# Patient Record
Sex: Male | Born: 1995
Health system: Southern US, Community
[De-identification: ages and names within clinical notes are randomized; demographics above are authoritative.]

---

## 2015-07-13 ENCOUNTER — Ambulatory Visit: Payer: Self-pay | Attending: Internal Medicine

## 2015-07-15 ENCOUNTER — Ambulatory Visit: Payer: Self-pay

## 2017-04-26 ENCOUNTER — Ambulatory Visit: Payer: BLUE CROSS/BLUE SHIELD | Admitting: Family Medicine

## 2017-04-26 ENCOUNTER — Other Ambulatory Visit: Payer: Self-pay

## 2017-04-26 ENCOUNTER — Encounter: Payer: Self-pay | Admitting: Family Medicine

## 2017-04-26 VITALS — BP 130/78 | HR 67 | Temp 98.1°F | Ht 66.0 in | Wt 123.2 lb

## 2017-04-26 DIAGNOSIS — Z131 Encounter for screening for diabetes mellitus: Secondary | ICD-10-CM

## 2017-04-26 DIAGNOSIS — Z13220 Encounter for screening for lipoid disorders: Secondary | ICD-10-CM

## 2017-04-26 DIAGNOSIS — Z0000 Encounter for general adult medical examination without abnormal findings: Secondary | ICD-10-CM | POA: Diagnosis not present

## 2017-04-26 DIAGNOSIS — H547 Unspecified visual loss: Secondary | ICD-10-CM

## 2017-04-26 LAB — COMPREHENSIVE METABOLIC PANEL
A/G RATIO: 1.5 (ref 1.2–2.2)
ALK PHOS: 117 IU/L (ref 39–117)
ALT: 14 IU/L (ref 0–44)
AST: 14 IU/L (ref 0–40)
Albumin: 4.5 g/dL (ref 3.5–5.5)
BILIRUBIN TOTAL: 0.3 mg/dL (ref 0.0–1.2)
BUN/Creatinine Ratio: 14 (ref 9–20)
BUN: 12 mg/dL (ref 6–20)
CALCIUM: 9.5 mg/dL (ref 8.7–10.2)
CHLORIDE: 100 mmol/L (ref 96–106)
CO2: 27 mmol/L (ref 20–29)
Creatinine, Ser: 0.88 mg/dL (ref 0.76–1.27)
GFR calc Af Amer: 139 mL/min/{1.73_m2} (ref >59)
GFR calc non Af Amer: 120 mL/min/{1.73_m2} (ref >59)
GLOBULIN, TOTAL: 3 g/dL (ref 1.5–4.5)
Glucose: 93 mg/dL (ref 65–99)
Potassium: 4.3 mmol/L (ref 3.5–5.2)
SODIUM: 139 mmol/L (ref 134–144)
Total Protein: 7.5 g/dL (ref 6.0–8.5)

## 2017-04-26 LAB — LIPID PANEL
CHOL/HDL RATIO: 2.9 ratio (ref 0.0–5.0)
CHOLESTEROL TOTAL: 145 mg/dL (ref 100–199)
HDL: 50 mg/dL (ref >39)
LDL Calculated: 79 mg/dL (ref 0–99)
TRIGLYCERIDES: 78 mg/dL (ref 0–149)
VLDL Cholesterol Cal: 16 mg/dL (ref 5–40)

## 2017-04-26 NOTE — Progress Notes (Signed)
Subjective:  By signing my name below, I, Essence Howell, attest that this documentation has been prepared under the direction and in the presence of Shade Flood, MD Electronically Signed: Charline Bills, ED Scribe 04/26/2017 at 11:39 AM.   Patient ID: Adam Duarte, male    DOB: [birth year], 22 y.o.   MRN: [id number]  Chief Complaint  Patient presents with  . Establish Care    general check up (eyes)   HPI Adam Duarte is a 22 y.o. male who presents to Primary Care at [facility] to establish care. Pt has not had a previous PCP recently. No pertinent medical problems other than a h/o urethral stone several yrs ago in [place]. Family h/o significant for HTN in his father. Pt is fasting at this visit.  Reports difficulty seeing. He has not had a recent eye exam. Pt does not wear eye glasses or contacts.   Visual Acuity Screening   Right eye Left eye Both eyes  Without correction: 20/40 20/40 20/40   With correction:      Dentist: last saw a dentist in [place] 4-5 months ago Exercise: 2-3 days/wk  STI Screening: Pt has never been sexually active. He had a non-reactive HIV screening when he moved to the [place] in Nov 2016 from [place].  There are no active problems to display for this patient.  History reviewed. No pertinent past medical history. History reviewed. No pertinent surgical history. Not on File Prior to Admission medications   Not on File   Social History   Socioeconomic History  . Marital status: Single    Spouse name: Not on file  . Number of children: Not on file  . Years of education: Not on file  . Highest education level: Not on file  Occupational History  . Not on file  Social Needs  . Financial resource strain: Not on file  . Food insecurity:    Worry: Not on file    Inability: Not on file  . Transportation needs:    Medical: Not on file    Non-medical: Not on file  Tobacco Use  . Smoking status: Never Smoker  . Smokeless tobacco: Never Used    Substance and Sexual Activity  . Alcohol use: Never    Frequency: Never  . Drug use: Not on file  . Sexual activity: Yes  Lifestyle  . Physical activity:    Days per week: Not on file    Minutes per session: Not on file  . Stress: Not on file  Relationships  . Social connections:    Talks on phone: Not on file    Gets together: Not on file    Attends religious service: Not on file    Active member of club or organization: Not on file    Attends meetings of clubs or organizations: Not on file    Relationship status: Not on file  . Intimate partner violence:    Fear of current or ex partner: Not on file    Emotionally abused: Not on file    Physically abused: Not on file    Forced sexual activity: Not on file  Other Topics Concern  . Not on file  Social History Narrative  . Not on file   Review of Systems  Eyes: Positive for visual disturbance.      Objective:   Physical Exam  Constitutional: He is oriented to person, place, and time. He appears well-developed and well-nourished.  HENT:  Head: Normocephalic and atraumatic.  Right Ear:  External ear normal.  Left Ear: External ear normal.  Mouth/Throat: Oropharynx is clear and moist.  Eyes: Pupils are equal, round, and reactive to light. Conjunctivae and EOM are normal.  Neck: Normal range of motion. Neck supple. No thyromegaly present.  Cardiovascular: Normal rate, regular rhythm, normal heart sounds and intact distal pulses.  Pulmonary/Chest: Effort normal and breath sounds normal. No respiratory distress. He has no wheezes.  Abdominal: Soft. He exhibits no distension. There is no tenderness. Hernia confirmed negative in the right inguinal area and confirmed negative in the left inguinal area.  Genitourinary: Prostate normal.  Musculoskeletal: Normal range of motion. He exhibits no edema or tenderness.  Lymphadenopathy:    He has no cervical adenopathy.  Neurological: He is alert and oriented to person, place, and  time. He has normal reflexes.  Skin: Skin is warm and dry.  Psychiatric: He has a normal mood and affect. His behavior is normal.  Vitals reviewed.    Vitals:   04/26/17 1054  BP: 130/78  Pulse: 67  Temp: 98.1 F (36.7 C)  TempSrc: Oral  SpO2: 99%  Weight: 123 lb 3.2 oz (55.9 kg)  Height: 5\' 6"  (1.676 m)      Assessment & Plan:  Adam Duarte is a 22 y.o. male Annual physical exam  - -anticipatory guidance as below in AVS, screening labs above. Health maintenance items as above in HPI discussed/recommended as applicable.   Screening for diabetes mellitus - Plan: Comprehensive metabolic panel  Screening for hyperlipidemia - Plan: Comprehensive metabolic panel, Lipid panel  Decreased visual acuity - Plan: Ambulatory referral to Ophthalmology  Interpreter present as well as some English spoken-  understanding expressed   No orders of the defined types were placed in this encounter.  Patient Instructions   I would recommend evaluation with eye doctor - I will refer you.   Thank you for coming in today.   Keeping you healthy  Get these tests  Blood pressure- Have your blood pressure checked once a year by your healthcare provider.  Normal blood pressure is 120/80.  Weight- Have your body mass index (BMI) calculated to screen for obesity.  BMI is a measure of body fat based on height and weight. You can also calculate your own BMI at https://www.west-esparza.com/.  Cholesterol- Have your cholesterol checked regularly starting at age 25, sooner may be necessary if you have diabetes, high blood pressure, if a family member developed heart diseases at an early age or if you smoke.   Chlamydia, HIV, and other sexual transmitted disease- Get screened each year until the age of 46 then within three months of each new sexual partner.  Diabetes- Have your blood sugar checked regularly if you have high blood pressure, high cholesterol, a family history of diabetes or if you are  overweight.  Get these vaccines  Flu shot- Every fall.  Tetanus shot- Every 10 years.  Menactra- Single dose; prevents meningitis.  Take these steps  Don't smoke- If you do smoke, ask your healthcare provider about quitting. For tips on how to quit, go to www.smokefree.gov or call 1-800-QUIT-NOW.  Be physically active- Exercise 5 days a week for at least 30 minutes.  If you are not already physically active start slow and gradually work up to 30 minutes of moderate physical activity.  Examples of moderate activity include walking briskly, mowing the yard, dancing, swimming bicycling, etc.  Eat a healthy diet- Eat a variety of healthy foods such as fruits, vegetables, low fat milk, low  fat cheese, yogurt, lean meats, poultry, fish, beans, tofu, etc.  For more information on healthy eating, go to www.thenutritionsource.org  Drink alcohol in moderation- Limit alcohol intake two drinks or less a day.  Never drink and drive.  Dentist- Brush and floss teeth twice daily; visit your dentis twice a year.  Depression-Your emotional health is as important as your physical health.  If you're feeling down, losing interest in things you normally enjoy please talk with your healthcare provider.  Gun Safety- If you keep a gun in your home, keep it unloaded and with the safety lock on.  Bullets should be stored separately.  Helmet use- Always wear a helmet when riding a motorcycle, bicycle, rollerblading or skateboarding.  Safe sex- If you may be exposed to a sexually transmitted infection, use a condom  Seat belts- Seat bels can save your life; always wear one.  Smoke/Carbon Monoxide detectors- These detectors need to be installed on the appropriate level of your home.  Replace batteries at least once a year.  Skin Cancer- When out in the sun, cover up and use sunscreen SPF 15 or higher.  Violence- If anyone is threatening or hurting you, please tell your healthcare provider.   IF you received  an x-ray today, you will receive an invoice from [facility]. Please contact [facility] at [phone] with questions or concerns regarding your invoice.   IF you received labwork today, you will receive an invoice from [facility]. Please contact [facility] at [phone] with questions or concerns regarding your invoice.   Our billing staff will not be able to assist you with questions regarding bills from these companies.  You will be contacted with the lab results as soon as they are available. The fastest way to get your results is to activate your My Chart account. Instructions are located on the last page of this paperwork. If you have not heard from us regarding the results in 2 weeks, please contact this office.       I personally performed the services described in this documentation, which was scribed in my presence. The recorded information has been reviewed and considered for accuracy and completeness, addended by me as needed, and agree with information above.  Signed,   Meredith StaggersJeffrey Margret Moat, MD Primary Care at [facility].  04/26/17 12:49 PM

## 2017-04-26 NOTE — Patient Instructions (Addendum)
I would recommend evaluation with eye doctor - I will refer you.   Thank you for coming in today.   Keeping you healthy  Get these tests  Blood pressure- Have your blood pressure checked once a year by your healthcare provider.  Normal blood pressure is 120/80.  Weight- Have your body mass index (BMI) calculated to screen for obesity.  BMI is a measure of body fat based on height and weight. You can also calculate your own BMI at https://www.west-esparza.com/www.nhlbisupport.com/bmi/.  Cholesterol- Have your cholesterol checked regularly starting at age 24, sooner may be necessary if you have diabetes, high blood pressure, if a family member developed heart diseases at an early age or if you smoke.   Chlamydia, HIV, and other sexual transmitted disease- Get screened each year until the age of 24 then within three months of each new sexual partner.  Diabetes- Have your blood sugar checked regularly if you have high blood pressure, high cholesterol, a family history of diabetes or if you are overweight.  Get these vaccines  Flu shot- Every fall.  Tetanus shot- Every 10 years.  Menactra- Single dose; prevents meningitis.  Take these steps  Don't smoke- If you do smoke, ask your healthcare provider about quitting. For tips on how to quit, go to www.smokefree.gov or call 1-800-QUIT-NOW.  Be physically active- Exercise 5 days a week for at least 30 minutes.  If you are not already physically active start slow and gradually work up to 30 minutes of moderate physical activity.  Examples of moderate activity include walking briskly, mowing the yard, dancing, swimming bicycling, etc.  Eat a healthy diet- Eat a variety of healthy foods such as fruits, vegetables, low fat milk, low fat cheese, yogurt, lean meats, poultry, fish, beans, tofu, etc.  For more information on healthy eating, go to www.thenutritionsource.org  Drink alcohol in moderation- Limit alcohol intake two drinks or less a day.  Never drink and  drive.  Dentist- Brush and floss teeth twice daily; visit your dentis twice a year.  Depression-Your emotional health is as important as your physical health.  If you're feeling down, losing interest in things you normally enjoy please talk with your healthcare provider.  Gun Safety- If you keep a gun in your home, keep it unloaded and with the safety lock on.  Bullets should be stored separately.  Helmet use- Always wear a helmet when riding a motorcycle, bicycle, rollerblading or skateboarding.  Safe sex- If you may be exposed to a sexually transmitted infection, use a condom  Seat belts- Seat bels can save your life; always wear one.  Smoke/Carbon Monoxide detectors- These detectors need to be installed on the appropriate level of your home.  Replace batteries at least once a year.  Skin Cancer- When out in the sun, cover up and use sunscreen SPF 15 or higher.  Violence- If anyone is threatening or hurting you, please tell your healthcare provider.   IF you received an x-ray today, you will receive an invoice from [facility]. Please contact [facility] at [phone] with questions or concerns regarding your invoice.   IF you received labwork today, you will receive an invoice from [facility]. Please contact [facility] at [phone] with questions or concerns regarding your invoice.   Our billing staff will not be able to assist you with questions regarding bills from these companies.  You will be contacted with the lab results as soon as they are available. The fastest way to get your results is to  activate your My Chart account. Instructions are located on the last page of this paperwork. If you have not heard from [place] regarding the results in 2 weeks, please contact this office.

## 2017-05-13 ENCOUNTER — Encounter: Payer: Self-pay | Admitting: *Deleted

## 2017-06-12 ENCOUNTER — Telehealth: Payer: Self-pay | Admitting: Family Medicine

## 2017-06-12 NOTE — Telephone Encounter (Signed)
Copied from CRM [id number]. Topic: Quick Communication - See Telephone Encounter >> Jun 12, 2017  4:39 PM Lorrine KinMcGee, Anisten Tomassi B, NT wrote: CRM for notification. See Telephone encounter for: 06/12/17. Marylene LandAngela with [facility] calling to see if office notes from 04/26/17 could be sent to their office. They're needed to know why the patient has been referred. Please advise.  CB#: [phone] Fax#: [phone]

## 2017-06-13 NOTE — Telephone Encounter (Signed)
Faxed over part of 04/26/2017 visit showing pt has visual disturbance.

## 2018-06-25 ENCOUNTER — Encounter: Payer: 59 | Admitting: Family Medicine

## 2018-07-07 ENCOUNTER — Encounter: Payer: Self-pay | Admitting: Family Medicine

## 2018-07-07 ENCOUNTER — Other Ambulatory Visit: Payer: Self-pay

## 2018-07-07 ENCOUNTER — Other Ambulatory Visit (HOSPITAL_COMMUNITY)
Admission: RE | Admit: 2018-07-07 | Discharge: 2018-07-07 | Disposition: A | Discharge status: Home / Self Care | Payer: 59 | Source: Ambulatory Visit | Attending: Family Medicine | Admitting: Family Medicine

## 2018-07-07 ENCOUNTER — Ambulatory Visit (INDEPENDENT_AMBULATORY_CARE_PROVIDER_SITE_OTHER): Payer: 59 | Admitting: Family Medicine

## 2018-07-07 VITALS — BP 134/81 | HR 61 | Temp 98.0°F | Resp 16 | Ht 65.35 in | Wt 120.0 lb

## 2018-07-07 DIAGNOSIS — Z113 Encounter for screening for infections with a predominantly sexual mode of transmission: Secondary | ICD-10-CM | POA: Diagnosis present

## 2018-07-07 DIAGNOSIS — Z0001 Encounter for general adult medical examination with abnormal findings: Secondary | ICD-10-CM | POA: Diagnosis not present

## 2018-07-07 DIAGNOSIS — Z0000 Encounter for general adult medical examination without abnormal findings: Secondary | ICD-10-CM

## 2018-07-07 DIAGNOSIS — H9192 Unspecified hearing loss, left ear: Secondary | ICD-10-CM

## 2018-07-07 NOTE — Patient Instructions (Addendum)
I will refer you for hearing screening. If abnormal, I can refer to ear specialist. Continue to wear hearing protection around loud noise.  STI testing results should be available in next 2 weeks.   Thanks for coming in today.   Keeping you healthy  Get these tests  Blood pressure- Have your blood pressure checked once a year by your healthcare provider.  Normal blood pressure is 120/80.  Weight- Have your body mass index (BMI) calculated to screen for obesity.  BMI is a measure of body fat based on height and weight. You can also calculate your own BMI at GravelBags.it.  Cholesterol- Have your cholesterol checked regularly starting at age 64, sooner may be necessary if you have diabetes, high blood pressure, if a family member developed heart diseases at an early age or if you smoke.   Chlamydia, HIV, and other sexual transmitted disease- Get screened each year until the age of 57 then within three months of each new sexual partner.  Diabetes- Have your blood sugar checked regularly if you have high blood pressure, high cholesterol, a family history of diabetes or if you are overweight.  Get these vaccines  Flu shot- Every fall.  Tetanus shot- Every 10 years.  Menactra- Single dose; prevents meningitis.  Take these steps  Don't smoke- If you do smoke, ask your healthcare provider about quitting. For tips on how to quit, go to www.smokefree.gov or call 1-800-QUIT-NOW.  Be physically active- Exercise 5 days a week for at least 30 minutes.  If you are not already physically active start slow and gradually work up to 30 minutes of moderate physical activity.  Examples of moderate activity include walking briskly, mowing the yard, dancing, swimming bicycling, etc.  Eat a healthy diet- Eat a variety of healthy foods such as fruits, vegetables, low fat milk, low fat cheese, yogurt, lean meats, poultry, fish, beans, tofu, etc.  For more information on healthy eating, go to  www.thenutritionsource.org  Drink alcohol in moderation- Limit alcohol intake two drinks or less a day.  Never drink and drive.  Dentist- Brush and floss teeth twice daily; visit your dentis twice a year.  Depression-Your emotional health is as important as your physical health.  If you're feeling down, losing interest in things you normally enjoy please talk with your healthcare provider.  Gun Safety- If you keep a gun in your home, keep it unloaded and with the safety lock on.  Bullets should be stored separately.  Helmet use- Always wear a helmet when riding a motorcycle, bicycle, rollerblading or skateboarding.  Safe sex- If you may be exposed to a sexually transmitted infection, use a condom  Seat belts- Seat bels can save your life; always wear one.  Smoke/Carbon Monoxide detectors- These detectors need to be installed on the appropriate level of your home.  Replace batteries at least once a year.  Skin Cancer- When out in the sun, cover up and use sunscreen SPF 15 or higher.  Violence- If anyone is threatening or hurting you, please tell your healthcare provider.    If you have lab work done today you will be contacted with your lab results within the next 2 weeks.  If you have not heard from [place] then please contact us. The fastest way to get your results is to register for My Chart.   IF you received an x-ray today, you will receive an invoice from [facility]. Please contact [facility] at [phone] with questions or concerns regarding your invoice.  IF you received labwork today, you will receive an invoice from [facility]. Please contact [facility] at [phone] with questions or concerns regarding your invoice.   Our billing staff will not be able to assist you with questions regarding bills from these companies.  You will be contacted with the lab results as soon as they are available. The fastest way to get your results is to activate your My Chart  account. Instructions are located on the last page of this paperwork. If you have not heard from us regarding the results in 2 weeks, please contact this office.

## 2018-07-07 NOTE — Progress Notes (Signed)
Subjective:    Patient ID: Adam Duarte, male    DOB: 10/26/1995, 23 y.o.   MRN: [id number]  HPI Adam Duarte is a 23 y.o. male Presents today for: Chief Complaint  Patient presents with  . Annual Exam   Last physical in April 2018, recommended ophthalmology exam at that time notes vision 20/40 bilaterally. Saw optho - had Rx glasses prescribed - appt again last week.   Family history of hypertension in father.  Fasting labs last year looked ok.   No health issues or concerns.   Decreased hearing in left ear past month. Just slight different.  No pain. No discharge. No firearms or other noise exposure besides work.  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 Hearing test at work 5-6 months ago ok.   Agrees to STI testing, but no known unprotected intercourse. No sx's, no prior STI's. 1 partner.   [facility] 07/07/2018 04/26/2017  Decreased Interest 0 0  Down, Depressed, Hopeless 0 0  PHQ - 2 Score 0 0   Immunization History  Administered Date(s) Administered  . Influenza,inj,Quad PF,6+ Mos 09/26/2017   Dentist - July 13th.   Exercise: minimal outside of work.     Results for orders placed or performed in visit on 04/26/17  Comprehensive metabolic panel  Result Value Ref Range   Glucose 93 65 - 99 mg/dL   BUN 12 6 - 20 mg/dL   Creatinine, Ser 4.090.88 0.76 - 1.27 mg/dL   GFR calc non Af Amer 120 >59 mL/min/1.73   GFR calc Af Amer 139 >59 mL/min/1.73   BUN/Creatinine Ratio 14 9 - 20   Sodium 139 134 - 144 mmol/L   Potassium 4.3 3.5 - 5.2 mmol/L   Chloride 100 96 - 106 mmol/L   CO2 27 20 - 29 mmol/L   Calcium 9.5 8.7 - 10.2 mg/dL   Total Protein 7.5 6.0 - 8.5 g/dL   Albumin 4.5 3.5 - 5.5 g/dL   Globulin, Total 3.0 1.5 - 4.5 g/dL   Albumin/Globulin Ratio 1.5 1.2 - 2.2   Bilirubin Total 0.3 0.0 - 1.2 mg/dL   Alkaline Phosphatase 117 39 - 117 IU/L   AST 14 0 - 40 IU/L   ALT 14 0 - 44 IU/L  Lipid panel  Result Value Ref Range   Cholesterol, Total 145 100 - 199 mg/dL   Triglycerides 78 0 - 149 mg/dL   HDL 50 >81>39 mg/dL   VLDL Cholesterol Cal 16 5 - 40 mg/dL   LDL Calculated 79 0 - 99 mg/dL   Chol/HDL Ratio 2.9 0.0 - 5.0 ratio    There are no active problems to display for this patient.  History reviewed. No pertinent past medical history. History reviewed. No pertinent surgical history. No Known Allergies Prior to Admission medications   Not on File   Social History   Socioeconomic History  . Marital status: Single    Spouse name: Not on file  . Number of children: Not on file  . Years of education: Not on file  . Highest education level: Not on file  Occupational History  . Not on file  Social Needs  . Financial resource strain: Not on file  . Food insecurity    Worry: Not on file    Inability: Not on file  . Transportation needs    Medical: Not on file    Non-medical: Not on file  Tobacco Use  . Smoking status: Never Smoker  . Smokeless  tobacco: Never Used  Substance and Sexual Activity  . Alcohol use: Never    Frequency: Never  . Drug use: Not on file  . Sexual activity: Yes  Lifestyle  . Physical activity    Days per week: Not on file    Minutes per session: Not on file  . Stress: Not on file  Relationships  . Social Herbalist on phone: Not on file    Gets together: Not on file    Attends religious service: Not on file    Active member of club or organization: Not on file    Attends meetings of clubs or organizations: Not on file    Relationship status: Not on file  . Intimate partner violence    Fear of current or ex partner: Not on file    Emotionally abused: Not on file    Physically abused: Not on file    Forced sexual activity: Not on file  Other Topics Concern  . Not on file  Social History Narrative  . Not on file    Review of Systems 13 point review of systems per patient health survey noted.  Negative other than as indicated above or in HPI.       Objective:   Physical Exam Vitals signs reviewed.  Constitutional:      Appearance: He is well-developed.  HENT:     Head: Normocephalic and atraumatic.     Right Ear: Hearing, tympanic membrane, ear canal and external ear normal.     Left Ear: Hearing, tympanic membrane, ear canal and external ear normal.     Ears:     Comments: Gross hearing intact.  Eyes:     Conjunctiva/sclera: Conjunctivae normal.     Pupils: Pupils are equal, round, and reactive to light.  Neck:     Musculoskeletal: Normal range of motion and neck supple.     Thyroid: No thyromegaly.  Cardiovascular:     Rate and Rhythm: Normal rate and regular rhythm.     Heart sounds: Normal heart sounds.  Pulmonary:     Effort: Pulmonary effort is normal. No respiratory distress.     Breath sounds: Normal breath sounds. No wheezing.  Abdominal:     General: There is no distension.     Palpations: Abdomen is soft.     Tenderness: There is no abdominal tenderness.  Musculoskeletal: Normal range of motion.        General: No tenderness.  Lymphadenopathy:     Cervical: No cervical adenopathy.  Skin:    General: Skin is warm and dry.  Neurological:     Mental Status: He is alert and oriented to person, place, and time.     Deep Tendon Reflexes: Reflexes are normal and symmetric.  Psychiatric:        Behavior: Behavior normal.    Vitals:   07/07/18 1529  BP: 134/81  Pulse: 61  Resp: 16  Temp: 98 F (36.7 C)  TempSrc: Oral  SpO2: 98%  Weight: 120 lb (54.4 kg)  Height: 5' 5.35" (1.66 m)       Assessment & Plan:  Adam Duarte is a 23 y.o. male Annual physical exam  - -anticipatory guidance as below in AVS, screening labs above. Health maintenance items as above in HPI discussed/recommended as applicable.   Decreased hearing, left - Plan: Ambulatory referral to Audiology  -Subjective decreased hearing, will refer to audiology for formal testing.  Both were pronounced, but reported screening at work  was  stable.  Continue to use hearing protection around loud noises.  Possible ENT eval depending on audiology results.  Routine screening for STI (sexually transmitted infection) - Plan: GC/Chlamydia probe amp ([place], HIV Antibody (routine testing w rflx), RPR   No orders of the defined types were placed in this encounter.  Patient Instructions   I will refer you for hearing screening. If abnormal, I can refer to ear specialist. Continue to wear hearing protection around loud noise.  STI testing results should be available in next 2 weeks.   Thanks for coming in today.   Keeping you healthy  Get these tests  Blood pressure- Have your blood pressure checked once a year by your healthcare provider.  Normal blood pressure is 120/80.  Weight- Have your body mass index (BMI) calculated to screen for obesity.  BMI is a measure of body fat based on height and weight. You can also calculate your own BMI at [email or web address]://www.west-[email or web address]/www.[email or web address]/.  Cholesterol- Have your cholesterol checked regularly starting at age 25, sooner may be necessary if you have diabetes, high blood pressure, if a family member developed heart diseases at an early age or if you smoke.   Chlamydia, HIV, and other sexual transmitted disease- Get screened each year until the age of 25 then within three months of each new sexual partner.  Diabetes- Have your blood sugar checked regularly if you have high blood pressure, high cholesterol, a family history of diabetes or if you are overweight.  Get these vaccines  Flu shot- Every fall.  Tetanus shot- Every 10 years.  Menactra- Single dose; prevents meningitis.  Take these steps  Don't smoke- If you do smoke, ask your healthcare provider about quitting. For tips on how to quit, go to www.smokefree.gov or call 1-800-QUIT-NOW.  Be physically active- Exercise 5 days a week for at least 30 minutes.  If you are not already physically active start slow and gradually work  up to 30 minutes of moderate physical activity.  Examples of moderate activity include walking briskly, mowing the yard, dancing, swimming bicycling, etc.  Eat a healthy diet- Eat a variety of healthy foods such as fruits, vegetables, low fat milk, low fat cheese, yogurt, lean meats, poultry, fish, beans, tofu, etc.  For more information on healthy eating, go to www.thenutritionsource.org  Drink alcohol in moderation- Limit alcohol intake two drinks or less a day.  Never drink and drive.  Dentist- Brush and floss teeth twice daily; visit your dentis twice a year.  Depression-Your emotional health is as important as your physical health.  If you're feeling down, losing interest in things you normally enjoy please talk with your healthcare provider.  Gun Safety- If you keep a gun in your home, keep it unloaded and with the safety lock on.  Bullets should be stored separately.  Helmet use- Always wear a helmet when riding a motorcycle, bicycle, rollerblading or skateboarding.  Safe sex- If you may be exposed to a sexually transmitted infection, use a condom  Seat belts- Seat bels can save your life; always wear one.  Smoke/Carbon Monoxide detectors- These detectors need to be installed on the appropriate level of your home.  Replace batteries at least once a year.  Skin Cancer- When out in the sun, cover up and use sunscreen SPF 15 or higher.  Violence- If anyone is threatening or hurting you, please tell your healthcare provider.    If you have lab work done today you will be contacted with  your lab results within the next 2 weeks.  If you have not heard from us then please contact us. The fastest way to get your results is to register for My Chart.   IF you received an x-ray today, you will receive an invoice from [facility]. Please contact [facility] at [phone] with questions or concerns regarding your invoice.   IF you received labwork today, you will receive  an invoice from [facility]. Please contact [facility] at [phone] with questions or concerns regarding your invoice.   Our billing staff will not be able to assist you with questions regarding bills from these companies.  You will be contacted with the lab results as soon as they are available. The fastest way to get your results is to activate your My Chart account. Instructions are located on the last page of this paperwork. If you have not heard from us regarding the results in 2 weeks, please contact this office.       Signed,   Meredith StaggersJeffrey Shanikwa State, MD [facility].  07/10/18 9:49 PM

## 2018-07-08 LAB — RPR: RPR Ser Ql: NONREACTIVE

## 2018-07-08 LAB — HIV ANTIBODY (ROUTINE TESTING W REFLEX): HIV Screen 4th Generation wRfx: NONREACTIVE

## 2018-07-09 LAB — GC/CHLAMYDIA PROBE AMP (~~LOC~~) NOT AT ARMC
Chlamydia: NEGATIVE
Neisseria Gonorrhea: NEGATIVE

## 2018-07-23 NOTE — Progress Notes (Signed)
Left a msg for patient to return called reference lab results.

## 2018-07-29 NOTE — Progress Notes (Signed)
Spoke with pt with interpretor [id number], pt verbalized understanding

## 2019-01-07 ENCOUNTER — Ambulatory Visit: Payer: 59 | Admitting: Audiology

## 2019-02-04 ENCOUNTER — Ambulatory Visit: Payer: 59 | Admitting: Audiology

## 2019-06-30 ENCOUNTER — Telehealth: Payer: Self-pay | Admitting: Family Medicine

## 2019-06-30 NOTE — Telephone Encounter (Signed)
[facility]  Office called and stated that they could not schedule patient due to the referral being closed and expired   Please advise

## 2019-09-15 ENCOUNTER — Ambulatory Visit (INDEPENDENT_AMBULATORY_CARE_PROVIDER_SITE_OTHER): Payer: 59 | Admitting: Emergency Medicine

## 2019-09-15 ENCOUNTER — Other Ambulatory Visit: Payer: Self-pay

## 2019-09-15 DIAGNOSIS — Z0000 Encounter for general adult medical examination without abnormal findings: Secondary | ICD-10-CM

## 2019-09-16 LAB — CMP14+EGFR
ALT: 33 IU/L (ref 0–44)
AST: 18 IU/L (ref 0–40)
Albumin/Globulin Ratio: 1.6 (ref 1.2–2.2)
Albumin: 4.2 g/dL (ref 4.1–5.2)
Alkaline Phosphatase: 101 IU/L (ref 44–121)
BUN/Creatinine Ratio: 14 (ref 9–20)
BUN: 12 mg/dL (ref 6–20)
Bilirubin Total: 0.3 mg/dL (ref 0.0–1.2)
CO2: 27 mmol/L (ref 20–29)
Calcium: 9 mg/dL (ref 8.7–10.2)
Chloride: 104 mmol/L (ref 96–106)
Creatinine, Ser: 0.85 mg/dL (ref 0.76–1.27)
GFR calc Af Amer: 139 mL/min/{1.73_m2} (ref >59)
GFR calc non Af Amer: 120 mL/min/{1.73_m2} (ref >59)
Globulin, Total: 2.7 g/dL (ref 1.5–4.5)
Glucose: 101 mg/dL — ABNORMAL HIGH (ref 65–99)
Potassium: 4.6 mmol/L (ref 3.5–5.2)
Sodium: 142 mmol/L (ref 134–144)
Total Protein: 6.9 g/dL (ref 6.0–8.5)

## 2019-09-16 LAB — CBC WITH DIFFERENTIAL/PLATELET
Basophils Absolute: 0 10*3/uL (ref 0.0–0.2)
Basos: 1 %
EOS (ABSOLUTE): 0.1 10*3/uL (ref 0.0–0.4)
Eos: 2 %
Hematocrit: 45.3 % (ref 37.5–51.0)
Hemoglobin: 15.6 g/dL (ref 13.0–17.7)
Immature Grans (Abs): 0 10*3/uL (ref 0.0–0.1)
Immature Granulocytes: 0 %
Lymphocytes Absolute: 2.5 10*3/uL (ref 0.7–3.1)
Lymphs: 47 %
MCH: 28.7 pg (ref 26.6–33.0)
MCHC: 34.4 g/dL (ref 31.5–35.7)
MCV: 83 fL (ref 79–97)
Monocytes Absolute: 0.5 10*3/uL (ref 0.1–0.9)
Monocytes: 10 %
Neutrophils Absolute: 2.1 10*3/uL (ref 1.4–7.0)
Neutrophils: 40 %
Platelets: 241 10*3/uL (ref 150–450)
RBC: 5.43 x10E6/uL (ref 4.14–5.80)
RDW: 12.7 % (ref 11.6–15.4)
WBC: 5.3 10*3/uL (ref 3.4–10.8)

## 2019-09-16 LAB — LIPID PANEL
Chol/HDL Ratio: 3.1 ratio (ref 0.0–5.0)
Cholesterol, Total: 147 mg/dL (ref 100–199)
HDL: 47 mg/dL (ref >39)
LDL Chol Calc (NIH): 81 mg/dL (ref 0–99)
Triglycerides: 100 mg/dL (ref 0–149)
VLDL Cholesterol Cal: 19 mg/dL (ref 5–40)

## 2019-09-16 LAB — HIV ANTIBODY (ROUTINE TESTING W REFLEX): HIV Screen 4th Generation wRfx: NONREACTIVE

## 2019-09-16 LAB — RPR: RPR Ser Ql: NONREACTIVE

## 2019-09-18 ENCOUNTER — Ambulatory Visit (INDEPENDENT_AMBULATORY_CARE_PROVIDER_SITE_OTHER): Payer: 59 | Admitting: Family Medicine

## 2019-09-18 ENCOUNTER — Other Ambulatory Visit (HOSPITAL_COMMUNITY)
Admission: RE | Admit: 2019-09-18 | Discharge: 2019-09-18 | Disposition: A | Discharge status: Home / Self Care | Payer: 59 | Source: Ambulatory Visit | Attending: Family Medicine | Admitting: Family Medicine

## 2019-09-18 ENCOUNTER — Other Ambulatory Visit: Payer: Self-pay

## 2019-09-18 ENCOUNTER — Encounter: Payer: Self-pay | Admitting: Family Medicine

## 2019-09-18 VITALS — BP 140/87 | HR 69 | Temp 98.8°F | Ht 65.0 in | Wt 127.0 lb

## 2019-09-18 DIAGNOSIS — R112 Nausea with vomiting, unspecified: Secondary | ICD-10-CM

## 2019-09-18 DIAGNOSIS — Z0001 Encounter for general adult medical examination with abnormal findings: Secondary | ICD-10-CM

## 2019-09-18 DIAGNOSIS — Z1159 Encounter for screening for other viral diseases: Secondary | ICD-10-CM | POA: Diagnosis not present

## 2019-09-18 DIAGNOSIS — B360 Pityriasis versicolor: Secondary | ICD-10-CM | POA: Diagnosis not present

## 2019-09-18 DIAGNOSIS — Z113 Encounter for screening for infections with a predominantly sexual mode of transmission: Secondary | ICD-10-CM | POA: Diagnosis not present

## 2019-09-18 DIAGNOSIS — H9193 Unspecified hearing loss, bilateral: Secondary | ICD-10-CM | POA: Diagnosis not present

## 2019-09-18 DIAGNOSIS — Z0000 Encounter for general adult medical examination without abnormal findings: Secondary | ICD-10-CM

## 2019-09-18 DIAGNOSIS — R739 Hyperglycemia, unspecified: Secondary | ICD-10-CM

## 2019-09-18 DIAGNOSIS — Z23 Encounter for immunization: Secondary | ICD-10-CM

## 2019-09-18 MED ORDER — KETOCONAZOLE 200 MG PO TABS: 400.0000 mg | ORAL_TABLET | Freq: Every day | ORAL | 0 refills | Status: DC

## 2019-09-18 NOTE — Patient Instructions (Addendum)
Recheck blood sugar in next 3-6 months as mildly elevated recently.   I will refer you to hearing specialist.   I will order test for nausea/vomiting.  Will discuss further at recheck in 1 month.  Light areas of the skin may be due to a fungus that can cause a rash.  Can try taking 2 pills all at once, 30 minutes later exercise to the point of sweating, do not shower for 2 hours.  If that does not help with that discoloration, I am happy to refer you to a skin specialist.    Tinea Versicolor  Tinea versicolor is a common fungal infection of the skin. It causes a rash that appears as light or dark patches on the skin. The rash most often occurs on the chest, back, neck, or upper arms. This condition is more common during warm weather. Other than affecting how your skin looks, tinea versicolor usually does not cause other problems. In most cases, the infection goes away in a few weeks with treatment. It may take a few months for the patches on your skin to return to your usual skin color. What are the causes? This condition occurs when a type of fungus that is normally present on the skin starts to overgrow. This fungus is a kind of yeast. The exact cause of the overgrowth is not known. This condition cannot be passed from one person to another (it is not contagious). What increases the risk? This condition is more likely to develop when certain factors are present, such as:  Heat and humidity.  Sweating too much.  Hormone changes.  Oily skin.  A weak disease-fighting system (immunesystem). What are the signs or symptoms? Symptoms of this condition include:  A rash of light or dark patches on your skin. The rash may have: ? Patches of tan or pink spots (on light skin). ? Patches of white or brown spots (on dark skin). ? Patches of skin that do not tan. ? Well-marked edges. ? Scales on the discolored areas.  Mild itching. How is this diagnosed? A health care provider can usually  diagnose this condition by looking at your skin. During the exam, he or she may use ultraviolet (UV) light to see how much of your skin has been affected. In some cases, a skin sample may be taken by scraping the rash. This sample will be viewed under a microscope to check for yeast overgrowth. How is this treated? Treatment for this condition may include:  Dandruff shampoo that is applied to the affected skin during showers or bathing.  Over-the-counter medicated skin cream, lotion, or soaps.  Prescription antifungal medicine in the form of skin cream or pills.  Medicine to help reduce itching. Follow these instructions at home:  Take over-the-counter and prescription medicines only as told by your health care provider.  Apply dandruff shampoo to the affected area if your health care provider told you to do that. You may be instructed to scrub the affected skin for several minutes each day.  Do not scratch the affected area of skin.  Avoid hot and humid conditions.  Do not use tanning booths.  Try to avoid sweating a lot. Contact a health care provider if:  Your symptoms get worse.  You have a fever.  You have redness, swelling, or pain at the site of your rash.  You have fluid or blood coming from your rash.  Your rash feels warm to the touch.  You have pus or a bad  smell coming from your rash.  Your rash returns (recurs) after treatment. Summary  Tinea versicolor is a common fungal infection of the skin. It causes a rash that appears as light or dark patches on the skin.  The rash most often occurs on the chest, back, neck, or upper arms.  A health care provider can usually diagnose this condition by looking at your skin.  Treatment may include applying shampoo to the skin and taking or applying medicines. This information is not intended to replace advice given to you by your health care provider. Make sure you discuss any questions you have with your health care  provider. Document Revised: 11/30/2016 Document Reviewed: 08/21/2016 Elsevier Patient Education  2020 Elsevier Inc.   Nausea, Adult Nausea is the feeling that you have an upset stomach or that you are about to vomit. Nausea on its own is not usually a serious concern, but it may be an early sign of a more serious medical problem. As nausea gets worse, it can lead to vomiting. If vomiting develops, or if you are not able to drink enough fluids, you are at risk of becoming dehydrated. Dehydration can make you tired and thirsty, cause you to have a dry mouth, and decrease how often you urinate. Older adults and people with other diseases or a weak disease-fighting system (immune system) are at higher risk for dehydration. The main goals of treating your nausea are:  To relieve your nausea.  To limit repeated nausea episodes.  To prevent vomiting and dehydration. Follow these instructions at home: Watch your symptoms for any changes. Tell your health care provider about them. Follow these instructions as told by your health care provider. Eating and drinking      Take an oral rehydration solution (ORS). This is a drink that is sold at pharmacies and retail stores.  Drink clear fluids slowly and in small amounts as you are able. Clear fluids include water, ice chips, low-calorie sports drinks, and fruit juice that has water added (diluted fruit juice).  Eat bland, easy-to-digest foods in small amounts as you are able. These foods include bananas, applesauce, rice, lean meats, toast, and crackers.  Avoid drinking fluids that contain a lot of sugar or caffeine, such as energy drinks, sports drinks, and soda.  Avoid alcohol.  Avoid spicy or fatty foods. General instructions  Take over-the-counter and prescription medicines only as told by your health care provider.  Rest at home while you recover.  Drink enough fluid to keep your urine pale yellow.  Breathe slowly and deeply when you  feel nauseous.  Avoid smelling things that have strong odors.  Wash your hands often using soap and water. If soap and water are not available, use hand sanitizer.  Make sure that all people in your household wash their hands well and often.  Keep all follow-up visits as told by your health care provider. This is important. Contact a health care provider if:  Your nausea gets worse.  Your nausea does not go away after two days.  You vomit.  You cannot drink fluids without vomiting.  You have any of the following: ? New symptoms. ? A fever. ? A headache. ? Muscle cramps. ? A rash. ? Pain while urinating.  You feel light-headed or dizzy. Get help right away if:  You have pain in your chest, neck, arm, or jaw.  You feel extremely weak or you faint.  You have vomit that is bright red or looks like coffee grounds.  You have bloody or black stools or stools that look like tar.  You have a severe headache, a stiff neck, or both.  You have severe pain, cramping, or bloating in your abdomen.  You have difficulty breathing or are breathing very quickly.  Your heart is beating very quickly.  Your skin feels cold and clammy.  You feel confused.  You have signs of dehydration, such as: ? Dark urine, very little urine, or no urine. ? Cracked lips. ? Dry mouth. ? Sunken eyes. ? Sleepiness. ? Weakness. These symptoms may represent a serious problem that is an emergency. Do not wait to see if the symptoms will go away. Get medical help right away. Call your local emergency services (911 in the [place] Do not drive yourself to the hospital. Summary  Nausea is the feeling that you have an upset stomach or that you are about to vomit. Nausea on its own is not usually a serious concern, but it may be an early sign of a more serious medical problem.  If vomiting develops, or if you are not able to drink enough fluids, you are at risk of becoming dehydrated.  Follow  recommendations for eating and drinking and take over-the-counter and prescription medicines only as told by your health care provider.  Contact a health care provider right away if your symptoms worsen or you have new symptoms.  Keep all follow-up visits as told by your health care provider. This is important. This information is not intended to replace advice given to you by your health care provider. Make sure you discuss any questions you have with your health care provider. Document Revised: 05/28/2017 Document Reviewed: 05/28/2017 Elsevier Patient Education  [employer].   If you have lab work done today you will be contacted with your lab results within the next 2 weeks.  If you have not heard from [place] then please contact us. The fastest way to get your results is to register for My Chart.   IF you received an x-ray today, you will receive an invoice from [facility]. Please contact [facility] at [phone] with questions or concerns regarding your invoice.   IF you received labwork today, you will receive an invoice from [facility]. Please contact [facility] at [phone] with questions or concerns regarding your invoice.   Our billing staff will not be able to assist you with questions regarding bills from these companies.  You will be contacted with the lab results as soon as they are available. The fastest way to get your results is to activate your My Chart account. Instructions are located on the last page of this paperwork. If you have not heard from [place] regarding the results in 2 weeks, please contact this office.

## 2019-09-18 NOTE — Progress Notes (Signed)
Subjective:  Patient ID: Adam Duarte, male    DOB: 01/22/1995  Age: 24 y.o. MRN: [id number]  CC:  Chief Complaint  Patient presents with  . Annual Exam    pt reports he feels well with no complants.  Pt states the provider referred him to get a hearing test done and he was unable to make appt to get it done and he needs a new referral for this.   Arabic video interpreter: [phone].   HPI Adam Duarte presents for   Annual physical exam, recently had lab work in preparation for today's visit. Last physical in July 2020. Other concerns on hx form.   No health changes since last visit.  Concern about hearing  - discussed last year - needs new referral as too long since our last visit - called 4-5 months ago.  Subjective decreased left hearing. Noise at work - wants to make sure hearing is ok. Wears hearing protection, but sometimes takes off. Wears about 70% of the time.  Baseline hearing test normal at work. 66yrand 8 months ago.  No change in hearing since last year. Feels about the same   Concern of skin discoloration. Upper chest. No change.  Lighter. Same for 20 years, no change in seasons.  No treatments.   Nausea: On occasion.  Past 2 years, after eating at times or drinking, abouone a month. Vomiting 4 weeks ago, vomited twice.  Notices after eating at times, or after drinking alcohol. Notices after 2 drinks. Not always after drinking alcohol, sometimes just after food.  No abdominal pain now but has when he feels nauseous.  No hx of PUD.  Had bladder stone 20 years ago.   STI screening: No unprotected intercourse since last sti screening. Recent hiv, rpr nonreactive.  Exercise - no regular exercise.   Wears glasses, appt with optho yesterday. Discussed sensitivity and itching and meds prescribed.   Dentist 3 months ago.   Immunization History  Administered Date(s) Administered  . Influenza,inj,Quad PF,6+ Mos 09/26/2017, 09/18/2019  . Tdap 09/18/2019  covid  19 vaccine in July    Results for orders placed or performed in visit on 09/15/19  Lipid panel  Result Value Ref Range   Cholesterol, Total 147 100 - 199 mg/dL   Triglycerides 100 0 - 149 mg/dL   HDL 47 >39 mg/dL   VLDL Cholesterol Cal 19 5 - 40 mg/dL   LDL Chol Calc (NIH) 81 0 - 99 mg/dL   Chol/HDL Ratio 3.1 0.0 - 5.0 ratio  CMP14+EGFR  Result Value Ref Range   Glucose 101 (H) 65 - 99 mg/dL   BUN 12 6 - 20 mg/dL   Creatinine, Ser 0.85 0.76 - 1.27 mg/dL   GFR calc non Af Amer 120 >59 mL/min/1.73   GFR calc Af Amer 139 >59 mL/min/1.73   BUN/Creatinine Ratio 14 9 - 20   Sodium 142 134 - 144 mmol/L   Potassium 4.6 3.5 - 5.2 mmol/L   Chloride 104 96 - 106 mmol/L   CO2 27 20 - 29 mmol/L   Calcium 9.0 8.7 - 10.2 mg/dL   Total Protein 6.9 6.0 - 8.5 g/dL   Albumin 4.2 4.1 - 5.2 g/dL   Globulin, Total 2.7 1.5 - 4.5 g/dL   Albumin/Globulin Ratio 1.6 1.2 - 2.2   Bilirubin Total 0.3 0.0 - 1.2 mg/dL   Alkaline Phosphatase 101 44 - 121 IU/L   AST 18 0 - 40 IU/L   ALT 33 0 - 44  IU/L  HIV Antibody (routine testing w rflx)  Result Value Ref Range   HIV Screen 4th Generation wRfx Non Reactive Non Reactive  RPR  Result Value Ref Range   RPR Ser Ql Non Reactive Non Reactive  CBC with Differential/Platelet  Result Value Ref Range   WBC 5.3 3.4 - 10.8 x10E3/uL   RBC 5.43 4.14 - 5.80 x10E6/uL   Hemoglobin 15.6 13.0 - 17.7 g/dL   Hematocrit 45.3 37.5 - 51.0 %   MCV 83 79 - 97 fL   MCH 28.7 26.6 - 33.0 pg   MCHC 34.4 31 - 35 g/dL   RDW 12.7 11.6 - 15.4 %   Platelets 241 150 - 450 x10E3/uL   Neutrophils 40 Not Estab. %   Lymphs 47 Not Estab. %   Monocytes 10 Not Estab. %   Eos 2 Not Estab. %   Basos 1 Not Estab. %   Neutrophils Absolute 2.1 1 - 7 x10E3/uL   Lymphocytes Absolute 2.5 0 - 3 x10E3/uL   Monocytes Absolute 0.5 0 - 0 x10E3/uL   EOS (ABSOLUTE) 0.1 0.0 - 0.4 x10E3/uL   Basophils Absolute 0.0 0 - 0 x10E3/uL   Immature Granulocytes 0 Not Estab. %   Immature Grans (Abs) 0.0  0.0 - 0.1 x10E3/uL      History There are no problems to display for this patient.  History reviewed. No pertinent past medical history. History reviewed. No pertinent surgical history. No Known Allergies Prior to Admission medications   Not on File   Social History   Socioeconomic History  . Marital status: Single    Spouse name: Not on file  . Number of children: Not on file  . Years of education: Not on file  . Highest education level: Not on file  Occupational History  . Not on file  Tobacco Use  . Smoking status: Current Every Day Smoker  . Smokeless tobacco: Never Used  . Tobacco comment: hooka  Vaping Use  . Vaping Use: Never used  Substance and Sexual Activity  . Alcohol use: Yes    Comment: twice a month  . Drug use: Never  . Sexual activity: Yes  Other Topics Concern  . Not on file  Social History Narrative  . Not on file   Social Determinants of Health   Financial Resource Strain:   . Difficulty of Paying Living Expenses: Not on file  Food Insecurity:   . Worried About Charity fundraiser in the Last Year: Not on file  . Ran Out of Food in the Last Year: Not on file  Transportation Needs:   . Lack of Transportation (Medical): Not on file  . Lack of Transportation (Non-Medical): Not on file  Physical Activity:   . Days of Exercise per Week: Not on file  . Minutes of Exercise per Session: Not on file  Stress:   . Feeling of Stress : Not on file  Social Connections:   . Frequency of Communication with Friends and Family: Not on file  . Frequency of Social Gatherings with Friends and Family: Not on file  . Attends Religious Services: Not on file  . Active Member of Clubs or Organizations: Not on file  . Attends Archivist Meetings: Not on file  . Marital Status: Not on file  Intimate Partner Violence:   . Fear of Current or Ex-Partner: Not on file  . Emotionally Abused: Not on file  . Physically Abused: Not on file  . Sexually Abused:  Not on file    Review of Systems As above.   Objective:   Vitals:   09/18/19 1521  BP: 140/87  Pulse: 69  Temp: 98.8 F (37.1 C)  TempSrc: Temporal  SpO2: 98%  Weight: 127 lb (57.6 kg)  Height: _0  (1.651 m)     Physical Exam Vitals reviewed.  Constitutional:      Appearance: He is well-developed.  HENT:     Head: Normocephalic and atraumatic.  Eyes:     Pupils: Pupils are equal, round, and reactive to light.  Neck:     Vascular: No carotid bruit or JVD.  Cardiovascular:     Rate and Rhythm: Normal rate and regular rhythm.     Heart sounds: Normal heart sounds. No murmur heard.   Pulmonary:     Effort: Pulmonary effort is normal.     Breath sounds: Normal breath sounds. No rales.  Skin:    General: Skin is warm and dry.     Comments: Areas of hypopigmentation across anterior chest, upper left chest.  See photo  Neurological:     Mental Status: He is alert and oriented to person, place, and time.         Assessment & Plan:  Verdell Dykman is a 24 y.o. male . Annual physical exam  -anticipatory guidance as below in AVS, screening labs above. Health maintenance items as above in HPI discussed/recommended as applicable.   Need for hepatitis C screening test  Need for prophylactic vaccination and inoculation against influenza - Plan: Flu Vaccine QUAD 36+ mos IM  Need for prophylactic vaccination with combined diphtheria-tetanus-pertussis (DTP) vaccine - Plan: Tdap vaccine greater than or equal to 7yo IM  Routine screening for STI (sexually transmitted infection) - Plan: GC/Chlamydia probe amp ([place])not at [facility], GC/Chlamydia probe amp ([place])not at [facility]  Hyperglycemia -Only few points elevated on recent testing.  Can recheck in 3 to 6 months.  Decreased hearing of both ears - Plan: Ambulatory referral to Audiology  -Subjective decreased hearing, more of concern of noise exposure with work.  Did recommend he wear hearing protection with all  loud noise at work.  Refer to audiologist for testing.  Tinea versicolor - Plan: ketoconazole (NIZORAL) 200 MG tablet  -Longstanding areas on chest, possible tinea versicolor. Trial of nizoral 436m once, then recheck in 1 month.  Consider derm eval.  Nausea and vomiting, intractability of vomiting not specified, unspecified vomiting type - Plan: [place]domen Limited RUQ  - intermittent symptoms. Reassuring exam at present.  Check ultrasound of gallbladder, liver, consider GI eval. Recheck 1 month, ER precautions.   Meds ordered this encounter  Medications  . ketoconazole (NIZORAL) 200 MG tablet    Sig: Take 2 tablets (400 mg total) by mouth daily.    Dispense:  2 tablet    Refill:  0   Patient Instructions    Recheck blood sugar in next 3-6 months as mildly elevated recently.   I will refer you to hearing specialist.   I will order test for nausea/vomiting.  Will discuss further at recheck in 1 month.  Light areas of the skin may be due to a fungus that can cause a rash.  Can try taking 2 pills all at once, 30 minutes later exercise to the point of sweating, do not shower for 2 hours.  If that does not help with that discoloration, I am happy to refer you to a skin specialist.    Tinea Versicolor  Tinea  versicolor is a common fungal infection of the skin. It causes a rash that appears as light or dark patches on the skin. The rash most often occurs on the chest, back, neck, or upper arms. This condition is more common during warm weather. Other than affecting how your skin looks, tinea versicolor usually does not cause other problems. In most cases, the infection goes away in a few weeks with treatment. It may take a few months for the patches on your skin to return to your usual skin color. What are the causes? This condition occurs when a type of fungus that is normally present on the skin starts to overgrow. This fungus is a kind of yeast. The exact cause of the overgrowth is not  known. This condition cannot be passed from one person to another (it is not contagious). What increases the risk? This condition is more likely to develop when certain factors are present, such as:  Heat and humidity.  Sweating too much.  Hormone changes.  Oily skin.  A weak disease-fighting system (immunesystem). What are the signs or symptoms? Symptoms of this condition include:  A rash of light or dark patches on your skin. The rash may have: ? Patches of tan or pink spots (on light skin). ? Patches of white or brown spots (on dark skin). ? Patches of skin that do not tan. ? Well-marked edges. ? Scales on the discolored areas.  Mild itching. How is this diagnosed? A health care provider can usually diagnose this condition by looking at your skin. During the exam, he or she may use ultraviolet (UV) light to see how much of your skin has been affected. In some cases, a skin sample may be taken by scraping the rash. This sample will be viewed under a microscope to check for yeast overgrowth. How is this treated? Treatment for this condition may include:  Dandruff shampoo that is applied to the affected skin during showers or bathing.  Over-the-counter medicated skin cream, lotion, or soaps.  Prescription antifungal medicine in the form of skin cream or pills.  Medicine to help reduce itching. Follow these instructions at home:  Take over-the-counter and prescription medicines only as told by your health care provider.  Apply dandruff shampoo to the affected area if your health care provider told you to do that. You may be instructed to scrub the affected skin for several minutes each day.  Do not scratch the affected area of skin.  Avoid hot and humid conditions.  Do not use tanning booths.  Try to avoid sweating a lot. Contact a health care provider if:  Your symptoms get worse.  You have a fever.  You have redness, swelling, or pain at the site of your  rash.  You have fluid or blood coming from your rash.  Your rash feels warm to the touch.  You have pus or a bad smell coming from your rash.  Your rash returns (recurs) after treatment. Summary  Tinea versicolor is a common fungal infection of the skin. It causes a rash that appears as light or dark patches on the skin.  The rash most often occurs on the chest, back, neck, or upper arms.  A health care provider can usually diagnose this condition by looking at your skin.  Treatment may include applying shampoo to the skin and taking or applying medicines. This information is not intended to replace advice given to you by your health care provider. Make sure you discuss any questions  you have with your health care provider. Document Revised: 11/30/2016 Document Reviewed: 08/21/2016 Elsevier Patient Education  [employer].   Nausea, Adult Nausea is the feeling that you have an upset stomach or that you are about to vomit. Nausea on its own is not usually a serious concern, but it may be an early sign of a more serious medical problem. As nausea gets worse, it can lead to vomiting. If vomiting develops, or if you are not able to drink enough fluids, you are at risk of becoming dehydrated. Dehydration can make you tired and thirsty, cause you to have a dry mouth, and decrease how often you urinate. Older adults and people with other diseases or a weak disease-fighting system (immune system) are at higher risk for dehydration. The main goals of treating your nausea are:  To relieve your nausea.  To limit repeated nausea episodes.  To prevent vomiting and dehydration. Follow these instructions at home: Watch your symptoms for any changes. Tell your health care provider about them. Follow these instructions as told by your health care provider. Eating and drinking      Take an oral rehydration solution (ORS). This is a drink that is sold at pharmacies and retail  stores.  Drink clear fluids slowly and in small amounts as you are able. Clear fluids include water, ice chips, low-calorie sports drinks, and fruit juice that has water added (diluted fruit juice).  Eat bland, easy-to-digest foods in small amounts as you are able. These foods include bananas, applesauce, rice, lean meats, toast, and crackers.  Avoid drinking fluids that contain a lot of sugar or caffeine, such as energy drinks, sports drinks, and soda.  Avoid alcohol.  Avoid spicy or fatty foods. General instructions  Take over-the-counter and prescription medicines only as told by your health care provider.  Rest at home while you recover.  Drink enough fluid to keep your urine pale yellow.  Breathe slowly and deeply when you feel nauseous.  Avoid smelling things that have strong odors.  Wash your hands often using soap and water. If soap and water are not available, use hand sanitizer.  Make sure that all people in your household wash their hands well and often.  Keep all follow-up visits as told by your health care provider. This is important. Contact a health care provider if:  Your nausea gets worse.  Your nausea does not go away after two days.  You vomit.  You cannot drink fluids without vomiting.  You have any of the following: ? New symptoms. ? A fever. ? A headache. ? Muscle cramps. ? A rash. ? Pain while urinating.  You feel light-headed or dizzy. Get help right away if:  You have pain in your chest, neck, arm, or jaw.  You feel extremely weak or you faint.  You have vomit that is bright red or looks like coffee grounds.  You have bloody or black stools or stools that look like tar.  You have a severe headache, a stiff neck, or both.  You have severe pain, cramping, or bloating in your abdomen.  You have difficulty breathing or are breathing very quickly.  Your heart is beating very quickly.  Your skin feels cold and clammy.  You feel  confused.  You have signs of dehydration, such as: ? Dark urine, very little urine, or no urine. ? Cracked lips. ? Dry mouth. ? Sunken eyes. ? Sleepiness. ? Weakness. These symptoms may represent a serious problem that is an  emergency. Do not wait to see if the symptoms will go away. Get medical help right away. Call your local emergency services (911 in the [place] Do not drive yourself to the hospital. Summary  Nausea is the feeling that you have an upset stomach or that you are about to vomit. Nausea on its own is not usually a serious concern, but it may be an early sign of a more serious medical problem.  If vomiting develops, or if you are not able to drink enough fluids, you are at risk of becoming dehydrated.  Follow recommendations for eating and drinking and take over-the-counter and prescription medicines only as told by your health care provider.  Contact a health care provider right away if your symptoms worsen or you have new symptoms.  Keep all follow-up visits as told by your health care provider. This is important. This information is not intended to replace advice given to you by your health care provider. Make sure you discuss any questions you have with your health care provider. Document Revised: 05/28/2017 Document Reviewed: 05/28/2017 Elsevier Patient Education  [employer].   If you have lab work done today you will be contacted with your lab results within the next 2 weeks.  If you have not heard from [place] then please contact us. The fastest way to get your results is to register for My Chart.   IF you received an x-ray today, you will receive an invoice from [facility]. Please contact [facility] at [phone] with questions or concerns regarding your invoice.   IF you received labwork today, you will receive an invoice from [facility]. Please contact [facility] at [phone] with questions or concerns regarding your invoice.   Our  billing staff will not be able to assist you with questions regarding bills from these companies.  You will be contacted with the lab results as soon as they are available. The fastest way to get your results is to activate your My Chart account. Instructions are located on the last page of this paperwork. If you have not heard from [place] regarding the results in 2 weeks, please contact this office.         Signed, Merri Ray, MD [facility]

## 2019-09-19 ENCOUNTER — Encounter: Payer: Self-pay | Admitting: Family Medicine

## 2019-09-22 LAB — GC/CHLAMYDIA PROBE AMP (~~LOC~~) NOT AT ARMC
Chlamydia: NEGATIVE
Comment: NEGATIVE
Comment: NORMAL
Neisseria Gonorrhea: NEGATIVE

## 2019-09-29 ENCOUNTER — Other Ambulatory Visit: Payer: 59

## 2019-10-01 ENCOUNTER — Other Ambulatory Visit: Payer: 59

## 2019-10-02 ENCOUNTER — Ambulatory Visit: Payer: 59 | Admitting: Audiologist

## 2019-10-05 ENCOUNTER — Ambulatory Visit: Payer: 59 | Admitting: Audiologist

## 2019-10-06 ENCOUNTER — Ambulatory Visit: Payer: 59 | Attending: Family Medicine | Admitting: Audiologist

## 2019-10-06 ENCOUNTER — Other Ambulatory Visit: Payer: Self-pay

## 2019-10-06 DIAGNOSIS — H833X3 Noise effects on inner ear, bilateral: Secondary | ICD-10-CM | POA: Insufficient documentation

## 2019-10-06 DIAGNOSIS — H9193 Unspecified hearing loss, bilateral: Secondary | ICD-10-CM

## 2019-10-06 NOTE — Procedures (Signed)
  [facility] [address] [place], [place]  [address] [phone]  AUDIOLOGICAL  EVALUATION  NAME: Adam Duarte     DOB:   06-Jan-1995      MRN: [id number]                                                                                     DATE: 10/06/2019     REFERENT: Shade Flood, MD STATUS: Outpatient DIAGNOSIS: Concerns for Decreased hearing of both ears    History: [name] was seen for an audiological evaluation. Arabic interpretor was provided in person for this appointment.  [name] is receiving a hearing evaluation due to concerns for hearing loss due to noise damage. [name] is concerned that the machinery noise at his workplace is damaging his hearing. He says he has to take off his hearing protection to communicate with others, but then the machinery noise is so loud it hurts his ears. The pain is dull and does not radiate. No pain or pressure reported in either ear in quiet. No tinnitus present in either ear. [name] denies any ear infections or pathology of the ear. Medical history negative for any risk factor for hearing loss. No other relevant case history reported.   Evaluation:   Otoscopy showed a clear view of the tympanic membranes, bilaterally  Tympanometry results were consistent with normal middle ear function, bilaterally    Audiometric testing was completed using conventional audiometry with supraural transducer. Speech Recognition Thresholds were consistent with pure tone averages. Word Recognition was excellent at conversation level. Pure tone thresholds show normal hearing in both ears. Test results are consistent with normal hearing.   Uncomfortable loudness level testing performed 500-4k Hz. All UCLs are between 75dB and 85dB.  [name] does not meet criteria for a diagnosis of hyperacusis, which is tolerance below 70dB. However he does have reduced tolerance for loud sounds compared to the normal tolerance level  of 90dB.   Results:  The test results were reviewed with [name]. He has good normal hearing in both ears with no evidence of damage from noise at work. He does have a reduced tolerance for loud sounds. I told [name] I can write a letter to his work place that he needs higher rated hearing protection due to this sensitivity. He said the current hearing protections is sufficient and that would not be necessary. He was encouraged to wear his hearing protection diligently, and try to communicate by stepping into a quiet office while at work. He reported understanding.   Recommendations: 1.   No further audiologic testing is needed unless future hearing concerns arise.  2.   Continue to wear hearing protection as recommended by OSHA guidelines while at work to protect normal hearing.    Ammie Ferrier  Audiologist, Au.D., CCC-A 10/06/2019  7:29 AM  Cc: Shade Flood, MD

## 2019-10-07 ENCOUNTER — Ambulatory Visit
Admission: RE | Admit: 2019-10-07 | Discharge: 2019-10-07 | Disposition: A | Discharge status: Home / Self Care | Payer: 59 | Source: Ambulatory Visit | Attending: Family Medicine | Admitting: Family Medicine

## 2019-10-07 ENCOUNTER — Telehealth: Payer: Self-pay | Admitting: Family Medicine

## 2019-10-07 DIAGNOSIS — R112 Nausea with vomiting, unspecified: Secondary | ICD-10-CM

## 2019-10-07 NOTE — Telephone Encounter (Signed)
LVMTCB to resch appt for 10/15/19 appt due to provider being out of the office. Pt can be put with any provider.

## 2019-10-07 NOTE — Telephone Encounter (Signed)
Pt called back and resch appt with FNP Just for 10/15/19. Due to provider being out of the office

## 2019-10-15 ENCOUNTER — Other Ambulatory Visit: Payer: Self-pay

## 2019-10-15 ENCOUNTER — Ambulatory Visit: Payer: 59 | Admitting: Family Medicine

## 2019-10-15 ENCOUNTER — Ambulatory Visit (INDEPENDENT_AMBULATORY_CARE_PROVIDER_SITE_OTHER): Payer: 59 | Admitting: Family Medicine

## 2019-10-15 VITALS — BP 138/74 | HR 66 | Temp 98.6°F | Ht 65.0 in | Wt 128.0 lb

## 2019-10-15 DIAGNOSIS — B360 Pityriasis versicolor: Secondary | ICD-10-CM

## 2019-10-15 DIAGNOSIS — R110 Nausea: Secondary | ICD-10-CM | POA: Diagnosis not present

## 2019-10-15 DIAGNOSIS — H9193 Unspecified hearing loss, bilateral: Secondary | ICD-10-CM

## 2019-10-15 NOTE — Progress Notes (Signed)
10/14/20213:12 PM  Adam Duarte 08-06-1995, 24 y.o., male [id number]  Chief Complaint  Patient presents with  . follow up abd [place]  . follow up audiology    HPI:   Patient is a 24 y.o. male with no past medical history significant for who presents today for follow up  Tinea versicolor -  Plan: ketoconazole (NIZORAL) 200 MG tablet             -Longstanding areas on chest, possible tinea versicolor. Trial of nizoral 400mg  once, then recheck in 1 month.  Consider derm eval. Pills did not make any change. Dermatology referral placed   Nausea and vomiting, intractability of vomiting not specified, unspecified vomiting type -  Plan: Abdomen Limited RUQ:  IMPRESSION: Normal right upper quadrant abdominal ultrasound.             - intermittent symptoms. Reassuring exam at present.  Check ultrasound of gallbladder, liver, consider GI eval. Recheck 1 month, ER precautions.  Still has nausea occasionally, if eats a lot or with certain smells. "Always been like that" Denies vomiting, burning, pain States "feels better" doesn't want GI eval   Decreased Hearing Hearing test was good. No issues. Sensitive to loud noises  Depression screen [facility] 2/9 10/15/2019 09/18/2019 09/18/2019  Decreased Interest 0 0 0  Down, Depressed, Hopeless 0 0 0  PHQ - 2 Score 0 0 0    Fall Risk  10/15/2019 09/18/2019 07/07/2018 04/26/2017  Falls in the past year? 0 0 0 No  Number falls in past yr: 0 - - -  Injury with Fall? 0 - 0 -  Follow up Falls evaluation completed Falls evaluation completed - -     No Known Allergies  Prior to Admission medications   Medication Sig Start Date End Date Taking? Authorizing Provider  ketoconazole (NIZORAL) 200 MG tablet Take 2 tablets (400 mg total) by mouth daily. 09/18/19   09/20/19, MD    No past medical history on file.  No past surgical history on file.  Social History   Tobacco Use  . Smoking status: Current Every Day Smoker  . Smokeless  tobacco: Never Used  . Tobacco comment: hooka  Substance Use Topics  . Alcohol use: Yes    Comment: twice a month    Family History  Problem Relation Age of Onset  . Hyperlipidemia Father   . Diabetes Maternal Grandmother     Review of Systems  HENT: Positive for hearing loss. Negative for ear discharge, ear pain and tinnitus.   Respiratory: Negative for cough and shortness of breath.   Cardiovascular: Negative for chest pain.  Gastrointestinal: Positive for nausea. Negative for abdominal pain, blood in stool, constipation, diarrhea, heartburn and vomiting.  Skin:       Areas of hypopigmentation across anterior chest, upper left chest.    Neurological: Negative for headaches.   OBJECTIVE:  Today's Vitals   10/15/19 1446  BP: 138/74  Pulse: 66  Temp: 98.6 F (37 C)  SpO2: 99%  Weight: 128 lb (58.1 kg)  Height: 5\' 5"  (1.651 m)   Body mass index is 21.3 kg/m.   Physical Exam Vitals reviewed.  Constitutional:      General: He is not in acute distress.    Appearance: He is normal weight. He is not ill-appearing.  HENT:     Head: Normocephalic and atraumatic.     Right Ear: Tympanic membrane, ear canal and external ear normal. There is no impacted cerumen.  Left Ear: Tympanic membrane, ear canal and external ear normal. There is no impacted cerumen.  Cardiovascular:     Rate and Rhythm: Normal rate and regular rhythm.     Pulses: Normal pulses.     Heart sounds: Normal heart sounds. No friction rub. No gallop.   Pulmonary:     Effort: Pulmonary effort is normal. No respiratory distress.     Breath sounds: Normal breath sounds. No stridor. No wheezing, rhonchi or rales.  Abdominal:     General: Abdomen is flat. Bowel sounds are normal. There is no distension.     Palpations: Abdomen is soft. There is no mass.     Tenderness: There is no abdominal tenderness.  Skin:    General: Skin is warm and dry.     Comments: Areas of hypopigmentation across anterior chest,  upper left chest.    Neurological:     Mental Status: He is alert.  Psychiatric:        Mood and Affect: Mood normal.        Behavior: Behavior normal.     No results found for this or any previous visit (from the past 24 hour(s)).  No results found.   ASSESSMENT and PLAN  Problem List Items Addressed This Visit    None    Visit Diagnoses    Tinea versicolor    -  Primary   Relevant Orders   Ambulatory referral to Dermatology   Decreased hearing of both ears  Continues to be sensitive to sounds, verbalizes audiology found no abnormalities Will continue to monitor.      Nausea without vomiting     States "feels better" doesn't want GI eval  Discussed ultrasound results. Educated it have increased issues with nausea or start vomiting please return to the clinic or ED      Return in about 6 months (around 04/14/2020) for lab recheck.   Adam Carls Rico Massar, FNP-BC Primary Care at [facility] [address] [place], [place] [address] Ph.  [phone] Fax [phone]

## 2019-10-15 NOTE — Patient Instructions (Signed)
Dermatology referral sent If you have increased issues with nausea or start vomiting please return to the clinic or ED  Nausea, Adult Nausea is feeling sick to your stomach or feeling that you are about to throw up (vomit). Feeling sick to your stomach is usually not serious, but it may be an early sign of a more serious medical problem. As you feel sicker to your stomach, you may throw up. If you throw up, or if you are not able to drink enough fluids, there is a risk that you may lose too much water in your body (get dehydrated). If you lose too much water in your body, you may:  Feel tired.  Feel thirsty.  Have a dry mouth.  Have cracked lips.  Go pee (urinate) less often. Older adults and people who have other diseases or a weak body defense system (immune system) have a higher risk of losing too much water in the body. The main goals of treating this condition are:  To relieve your nausea.  To ensure your nausea occurs less often.  To prevent throwing up and losing too much fluid. Follow these instructions at home: Watch your symptoms for any changes. Tell your doctor about them. Follow these instructions as told by your doctor. Eating and drinking      Take an ORS (oral rehydration solution). This is a drink that is sold at pharmacies and stores.  Drink clear fluids in small amounts as you are able. These include: ? Water. ? Ice chips. ? Fruit juice that has water added (diluted fruit juice). ? Low-calorie sports drinks.  Eat bland, easy-to-digest foods in small amounts as you are able, such as: ? Bananas. ? Applesauce. ? Rice. ? Low-fat (lean) meats. ? Toast. ? Crackers.  Avoid drinking fluids that have a lot of sugar or caffeine in them. This includes energy drinks, sports drinks, and soda.  Avoid alcohol.  Avoid spicy or fatty foods. General instructions  Take over-the-counter and prescription medicines only as told by your doctor.  Rest at home while  you get better.  Drink enough fluid to keep your pee (urine) pale yellow.  Take slow and deep breaths when you feel sick to your stomach.  Avoid food or things that have strong smells.  Wash your hands often with soap and water. If you cannot use soap and water, use hand sanitizer.  Make sure that all people in your home wash their hands well and often.  Keep all follow-up visits as told by your doctor. This is important. Contact a doctor if:  You feel sicker to your stomach.  You feel sick to your stomach for more than 2 days.  You throw up.  You are not able to drink fluids without throwing up.  You have new symptoms.  You have a fever.  You have a headache.  You have muscle cramps.  You have a rash.  You have pain while peeing.  You feel light-headed or dizzy. Get help right away if:  You have pain in your chest, neck, arm, or jaw.  You feel very weak or you pass out (faint).  You have throw up that is bright red or looks like coffee grounds.  You have bloody or black poop (stools) or poop that looks like tar.  You have a very bad headache, a stiff neck, or both.  You have very bad pain, cramping, or bloating in your belly (abdomen).  You have trouble breathing or you are breathing  very quickly.  Your heart is beating very quickly.  Your skin feels cold and clammy.  You feel confused.  You have signs of losing too much water in your body, such as: ? Dark pee, very little pee, or no pee. ? Cracked lips. ? Dry mouth. ? Sunken eyes. ? Sleepiness. ? Weakness. These symptoms may be an emergency. Do not wait to see if the symptoms will go away. Get medical help right away. Call your local emergency services (911 in the [place] Do not drive yourself to the hospital. Summary  Nausea is feeling sick to your stomach or feeling that you are about to throw up (vomit).  If you throw up, or if you are not able to drink enough fluids, there is a risk that you  may lose too much water in your body (get dehydrated).  Eat and drink what your doctor tells you. Take over-the-counter and prescription medicines only as told by your doctor.  Contact a doctor right away if your symptoms get worse or you have new symptoms.  Keep all follow-up visits as told by your doctor. This is important. This information is not intended to replace advice given to you by your health care provider. Make sure you discuss any questions you have with your health care provider. Document Revised: 05/28/2017 Document Reviewed: 05/28/2017 Elsevier Patient Education  2020 [employer].

## 2019-10-16 ENCOUNTER — Telehealth: Payer: Self-pay | Admitting: Family Medicine

## 2019-10-16 NOTE — Telephone Encounter (Signed)
Called and spoke to Sun[place] their office is an ENT and not a DERM? Can we please correct this to get him to a DERM for skin issues

## 2019-10-16 NOTE — Telephone Encounter (Signed)
Adam Duarte calling from Dr. Marrian Salvage office/  Has some questions about referral / and is unclear of what  Tinea versisolor / unsure of what provider is needing so she can schedule    Please reach out to [place] [phone]

## 2020-03-15 ENCOUNTER — Ambulatory Visit: Payer: 59 | Admitting: Dermatology

## 2021-01-09 IMAGING — US US ABDOMEN LIMITED
1 series · 14 of 25 positions shown · non-contrast
Comparison: None.

CLINICAL DATA: Nausea and vomiting for 2 years.

EXAM:
ULTRASOUND ABDOMEN LIMITED RIGHT UPPER QUADRANT

[Series 1: us abdomen limited · 0.14mm/px · 14 of 44 slices shown]
[im 1/44]
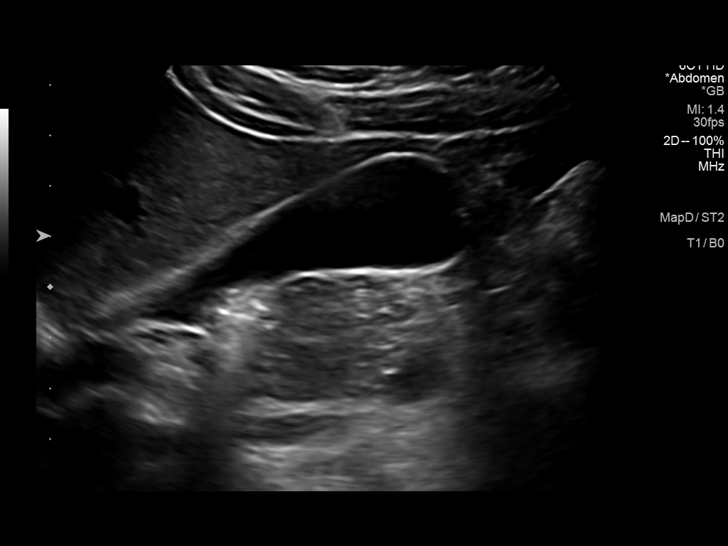
[im 4/44]
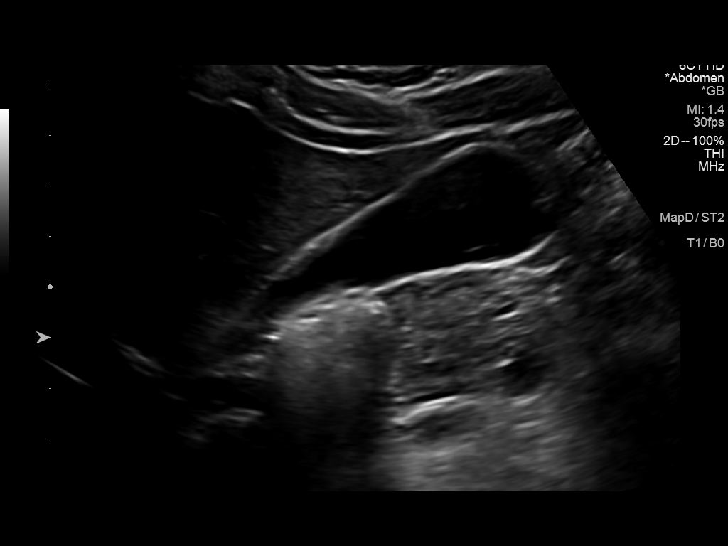
[im 8/44]
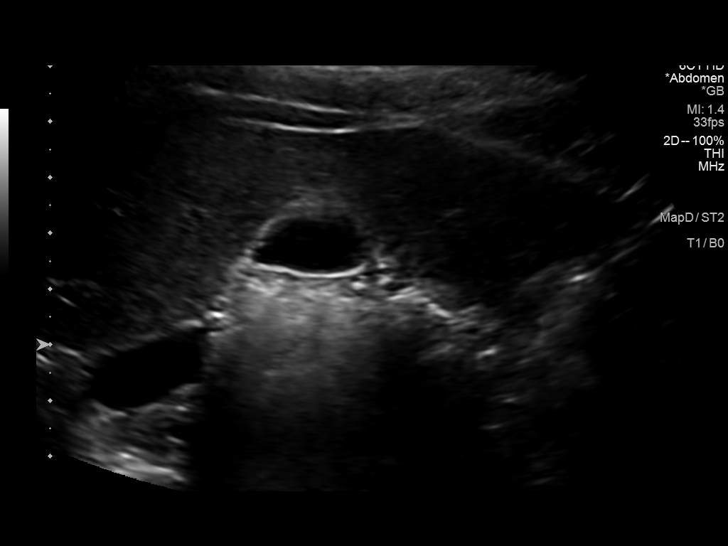
[im 11/44]
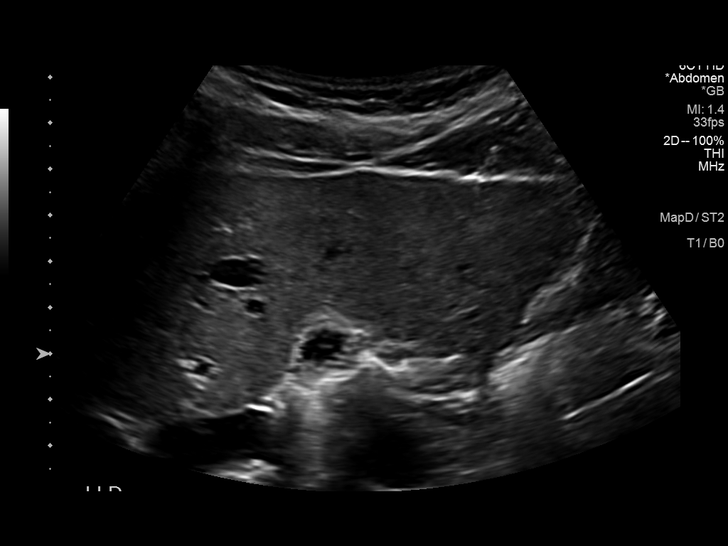
[im 15/44]
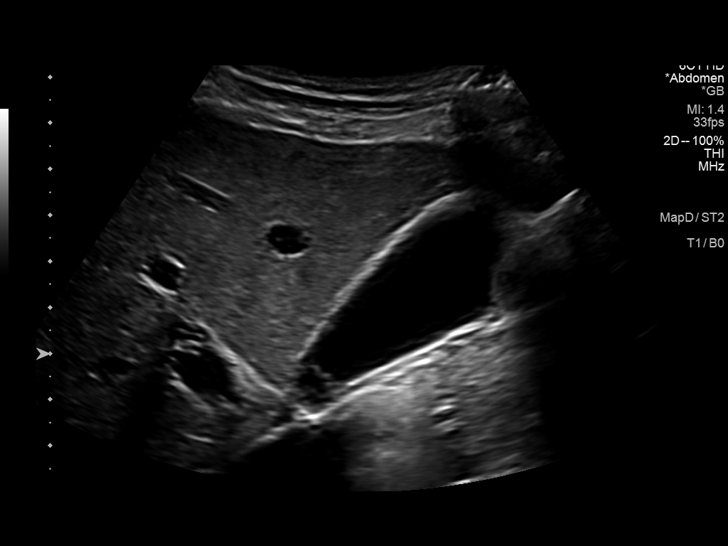
[im 17/44]
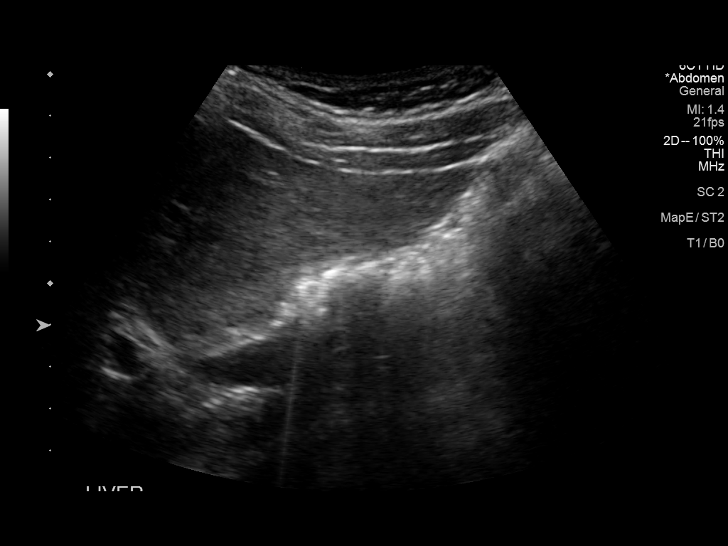
[im 20/44]
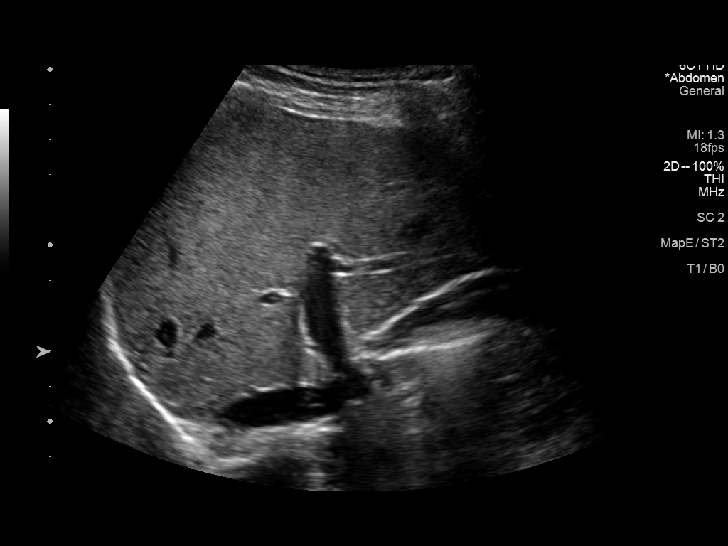
[im 24/44]
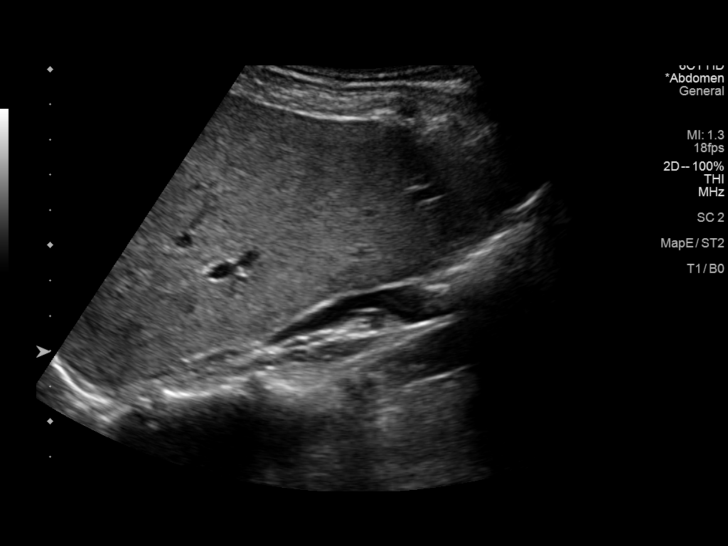
[im 27/44]
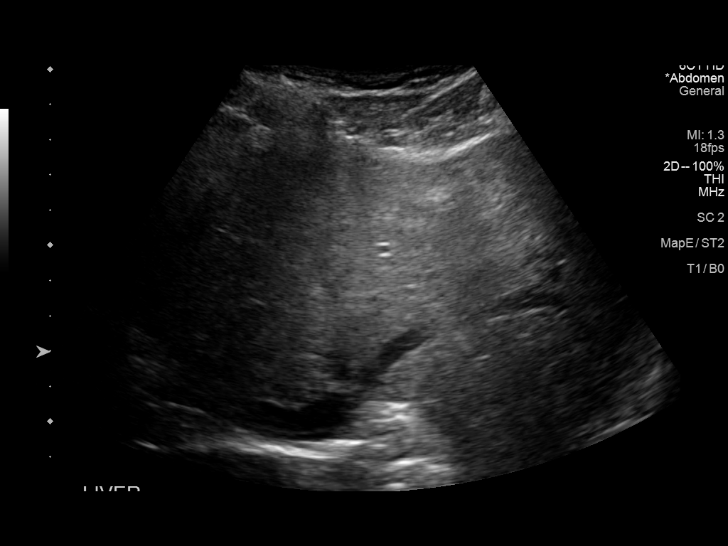
[im 29/44]
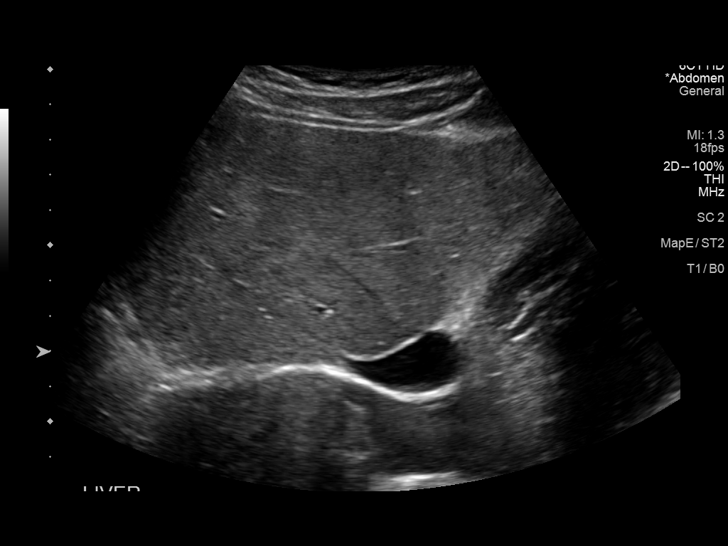
[im 33/44]
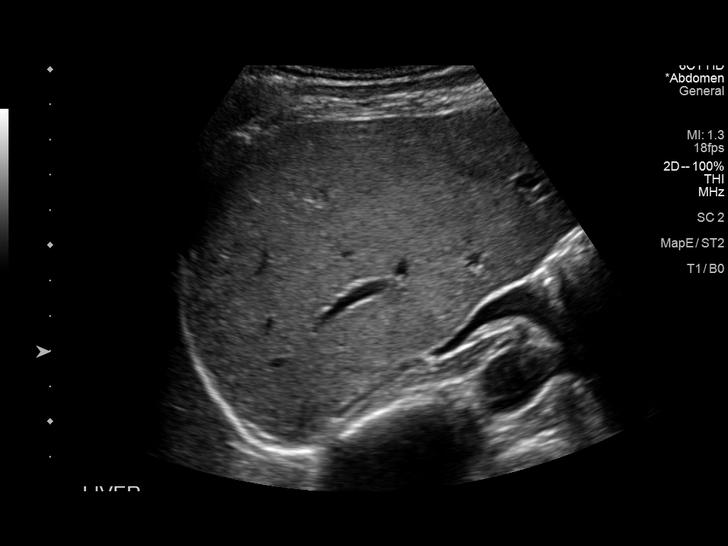
[im 36/44]
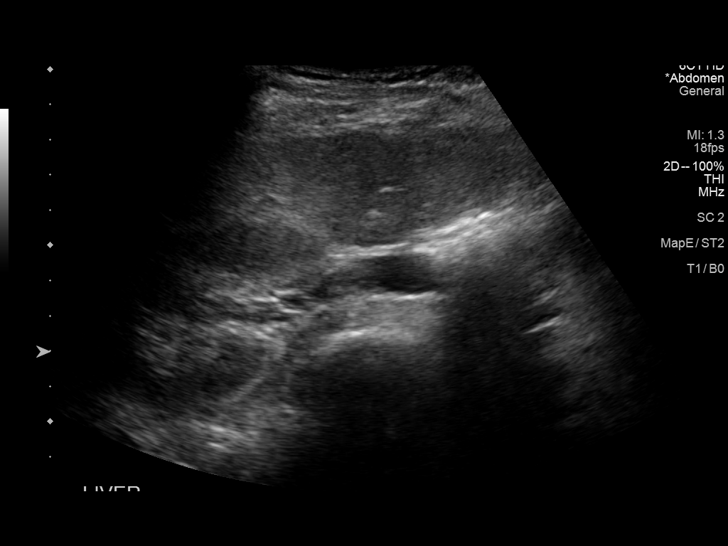
[im 40/44]
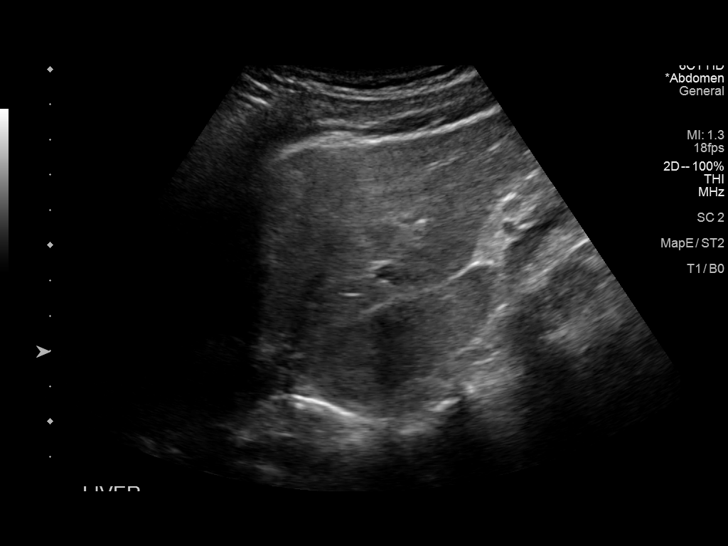
[im 44/44]
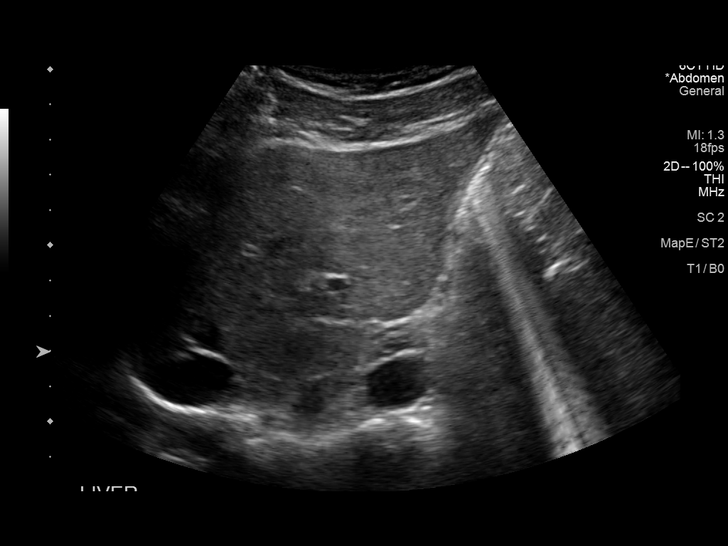

[14 of 25 positions shown; findings below may reference images not displayed]

FINDINGS: Gallbladder:

No gallstones or wall thickening visualized. No sonographic Murphy
sign noted by sonographer.

Common bile duct:

Diameter: 2 mm

Liver:

No focal lesion identified. Within normal limits in parenchymal
echogenicity. Portal vein is patent on color Doppler imaging with
normal direction of blood flow towards the liver.

Other: None.
IMPRESSION: Normal right upper quadrant abdominal ultrasound.

## 2021-01-22 ENCOUNTER — Ambulatory Visit (HOSPITAL_COMMUNITY)
Admission: EM | Admit: 2021-01-22 | Discharge: 2021-01-22 | Disposition: A | Discharge status: Home / Self Care | Payer: 59 | Attending: Emergency Medicine | Admitting: Emergency Medicine

## 2021-01-22 ENCOUNTER — Other Ambulatory Visit: Payer: Self-pay

## 2021-01-22 ENCOUNTER — Encounter (HOSPITAL_COMMUNITY): Payer: Self-pay | Admitting: *Deleted

## 2021-01-22 DIAGNOSIS — L509 Urticaria, unspecified: Secondary | ICD-10-CM

## 2021-01-22 DIAGNOSIS — B360 Pityriasis versicolor: Secondary | ICD-10-CM | POA: Diagnosis not present

## 2021-01-22 MED ORDER — METHYLPREDNISOLONE SODIUM SUCC 125 MG IJ SOLR
125.0000 mg | Freq: Once | INTRAMUSCULAR | Status: AC
  Administered 2021-01-22: 125 mg via INTRAMUSCULAR

## 2021-01-22 MED ORDER — METHYLPREDNISOLONE 4 MG PO TBPK: ORAL_TABLET | ORAL | 0 refills | Status: DC

## 2021-01-22 MED ORDER — FLUCONAZOLE 200 MG PO TABS: 200.0000 mg | ORAL_TABLET | ORAL | 0 refills | Status: AC

## 2021-01-22 MED ORDER — METHYLPREDNISOLONE SODIUM SUCC 125 MG IJ SOLR
INTRAMUSCULAR | Status: AC
  Filled 2021-01-22: qty 2

## 2021-01-22 MED ORDER — CETIRIZINE HCL 10 MG PO TABS: 10.0000 mg | ORAL_TABLET | Freq: Every day | ORAL | 2 refills | Status: DC

## 2021-01-22 MED ORDER — SELENIUM SULFIDE 2.5 % EX LOTN: TOPICAL_LOTION | CUTANEOUS | 2 refills | Status: DC

## 2021-01-22 MED ORDER — MONTELUKAST SODIUM 10 MG PO TABS: 10.0000 mg | ORAL_TABLET | Freq: Every day | ORAL | 2 refills | Status: DC

## 2021-01-22 MED ORDER — KETOCONAZOLE 2 % EX CREA: TOPICAL_CREAM | CUTANEOUS | 2 refills | Status: DC

## 2021-01-22 NOTE — ED Triage Notes (Signed)
T reports itching all over with out rash.

## 2021-01-22 NOTE — ED Provider Notes (Signed)
[place]    CSN: [id number] Arrival date & time: 01/22/21  1009    HISTORY   Chief Complaint  Patient presents with   itching all over   HPI Adam Duarte is a 26 y.o. male. Patient complains of a rash less itch.  EMR reviewed, patient has a history of tinea corporis, has been referred to dermatology for this, most recently seen in October 2021.  Patient states that the tinea has never itched in the past, states has had this for easily 20 years.  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  Patient states they have not switched any of the products to use at his job, has not changed any of his detergents for laundry or body wash, has not changed his diet in any way.  Patient denies recent increase in stress.  Patient states he is never had this before.  States the itching will appear on 1 location of his body, last for about an hour then disappear and reappear in other location of his body.  Patient states the first location was his upper thighs, states that this time he is having itching on his right forearm (not the left) and a little bit on his left upper forehead.  Patient states the itching on his forehead appeared after he arrived to urgent care.  The history is provided by the patient.  History reviewed. No pertinent past medical history. Patient Active Problem List   Diagnosis Date Noted   Tinea versicolor 10/15/2019   Nausea without vomiting 10/15/2019   Decreased hearing of both ears 10/15/2019   History reviewed. No pertinent surgical history.  Home Medications    Prior to Admission medications   Not on File    Family History Family History  Problem Relation Age of Onset   Hyperlipidemia Father    Diabetes Maternal Grandmother    Social History Social History   Tobacco Use   Smoking status: Every Day   Smokeless tobacco: Never   Tobacco comments:    hooka   Vaping Use   Vaping Use: Never used  Substance Use Topics   Alcohol use: Yes    Comment: twice a month   Drug use: Never   Allergies   Patient has no known allergies.  Review of Systems Review of Systems Pertinent findings noted in history of present illness.   Physical Exam Triage Vital Signs ED Triage Vitals  Enc Vitals Group     BP 10/28/20 0827 (!) 147/82     Pulse Rate 10/28/20 0827 72     Resp 10/28/20 0827 18     Temp 10/28/20 0827 98.3 F (36.8 C)     Temp Source 10/28/20 0827 Oral     SpO2 10/28/20 0827 98 %     Weight --      Height --      Head Circumference --      Peak Flow --      Pain Score 10/28/20 0826 5     Pain Loc --      Pain Edu? --      Excl. in [place]? --   No data found.  Updated Vital Signs BP 130/65    Pulse 63    Temp 98.3 F (36.8 C)    Resp 18    SpO2 100%   Physical Exam Vitals and nursing note reviewed.  Constitutional:  General: He is not in acute distress.    Appearance: Normal appearance. He is not ill-appearing.  HENT:     Head: Normocephalic and atraumatic.  Eyes:     General: Lids are normal.        Right eye: No discharge.        Left eye: No discharge.     Extraocular Movements: Extraocular movements intact.     Conjunctiva/sclera: Conjunctivae normal.     Right eye: Right conjunctiva is not injected.     Left eye: Left conjunctiva is not injected.  Neck:     Trachea: Trachea and phonation normal.  Cardiovascular:     Rate and Rhythm: Normal rate and regular rhythm.     Pulses: Normal pulses.     Heart sounds: Normal heart sounds. No murmur heard.   No friction rub. No gallop.  Pulmonary:     Effort: Pulmonary effort is normal. No accessory muscle usage, prolonged expiration or respiratory distress.     Breath sounds: Normal breath sounds. No stridor, decreased air movement or transmitted upper airway sounds. No decreased breath sounds, wheezing, rhonchi or rales.  Chest:     Chest wall: No tenderness.   Musculoskeletal:        General: Normal range of motion.     Cervical back: Normal range of motion and neck supple. Normal range of motion.  Lymphadenopathy:     Cervical: No cervical adenopathy.  Skin:    General: Skin is warm and dry.     Findings: Rash (Tinea versicolor spread across chest and upper back.  Patient has acute urticarial type rash on right forearm over elbow and left forehead at this time.) present. No erythema.  Neurological:     General: No focal deficit present.     Mental Status: He is alert and oriented to person, place, and time.  Psychiatric:        Mood and Affect: Mood normal.        Behavior: Behavior normal.    Visual Acuity Right Eye Distance:   Left Eye Distance:   Bilateral Distance:    Right Eye Near:   Left Eye Near:    Bilateral Near:     UC Couse / Diagnostics / Procedures:    EKG  Radiology No results found.  Procedures Procedures (including critical care time)  UC Diagnoses / Final Clinical Impressions(s)   I have reviewed the triage vital signs and the nursing notes.  Pertinent labs & imaging results that were available during my care of the patient were reviewed by me and considered in my medical decision making (see chart for details).    Final diagnoses:  Urticaria  Tinea versicolor   Patient provided with Solu-Medrol in the office and a tapering dose oral steroid.  Advised to take cetirizine and Singulair for suppression for the next month, can stop, resume if needed if itching symptoms begin to recur.  Patient also provided with sleep supplement, ketoconazole, Diflucan for longstanding tinea corporis.  Return precautions advised.  ED Prescriptions     Medication Sig Dispense Auth. Provider   methylPREDNISolone (MEDROL DOSEPAK) 4 MG TBPK tablet Take 24 mg on day 1, 20 mg on day 2, 16 mg on day 3, 12 mg on day 4, 8 mg on day 5, 4 mg on day 6. 21 tablet Lynden Oxford Scales, PA-C   montelukast (SINGULAIR) 10 MG tablet Take  1 tablet (10 mg total) by mouth at bedtime. 30 tablet Lynden Oxford Scales,  PA-C   cetirizine (ZYRTEC) 10 MG tablet Take 1 tablet (10 mg total) by mouth daily. 30 tablet Lynden Oxford Scales, PA-C   selenium sulfide (SELSUN) 2.5 % shampoo Apply to affected areas of wet skin, lather and leave on for 10 minutes then rinse.  Perform this once daily for 10 days. 118 mL Lynden Oxford Scales, PA-C   ketoconazole (NIZORAL) 2 % cream Apply to affected areas and immediate surrounding areas once daily for 3 weeks. 120 g Lynden Oxford Scales, PA-C   fluconazole (DIFLUCAN) 200 MG tablet Take 1 tablet (200 mg total) by mouth once a week for 4 doses. Take 2 tablets today.  Take 2 tablets 1 week after taking first 2 tablets. 4 tablet Lynden Oxford Scales, PA-C      PDMP not reviewed this encounter.  Pending results:  Labs Reviewed - No data to display  Medications Ordered in UC: Medications  methylPREDNISolone sodium succinate (SOLU-MEDROL) 125 mg/2 mL injection 125 mg (125 mg Intramuscular Given 01/22/21 1130)    Disposition Upon Discharge:  Condition: stable for discharge home Home: take medications as prescribed; routine discharge instructions as discussed; follow up as advised.  Patient presented with an acute illness with associated systemic symptoms and significant discomfort requiring urgent management. In my opinion, this is a condition that a prudent lay person (someone who possesses an average knowledge of health and medicine) may potentially expect to result in complications if not addressed urgently such as respiratory distress, impairment of bodily function or dysfunction of bodily organs.   Routine symptom specific, illness specific and/or disease specific instructions were discussed with the patient and/or caregiver at length.   As such, the patient has been evaluated and assessed, work-up was performed and treatment was provided in alignment with urgent care protocols and evidence  based medicine.  Patient/parent/caregiver has been advised that the patient may require follow up for further testing and treatment if the symptoms continue in spite of treatment, as clinically indicated and appropriate.  If the patient was tested for COVID-19, Influenza and/or RSV, then the patient/parent/guardian was advised to isolate at home pending the results of his/her diagnostic coronavirus test and potentially longer if theyre positive. I have also advised pt that if his/her COVID-19 test returns positive, it's recommended to self-isolate for at least 10 days after symptoms first appeared AND until fever-free for 24 hours without fever reducer AND other symptoms have improved or resolved. Discussed self-isolation recommendations as well as instructions for household member/close contacts as per the [facility] and [place] DHHS, and also gave patient the [place] packet with this information.  Patient/parent/caregiver has been advised to return to the [facility] or PCP in 3-5 days if no better; to PCP or the Emergency Department if new signs and symptoms develop, or if the current signs or symptoms continue to change or worsen for further workup, evaluation and treatment as clinically indicated and appropriate  The patient will follow up with their current PCP if and as advised. If the patient does not currently have a PCP we will assist them in obtaining one.   The patient may need specialty follow up if the symptoms continue, in spite of conservative treatment and management, for further workup, evaluation, consultation and treatment as clinically indicated and appropriate.   Patient/parent/caregiver verbalized understanding and agreement of plan as discussed.  All questions were addressed during visit.  Please see discharge instructions below for further details of plan.  Discharge Instructions:   Discharge Instructions  For your acute itching and urticaria, you received an injection of  methylprednisolone in the office today.  This is a strong steroid and should significantly reduce your itching within the next 2 to 3 hours.  Tomorrow morning, please begin a descending dose of methylprednisolone by taking 1 row of tablets and the steroid Dosepak each day with your breakfast meal.  To keep the urticaria completely under control for the next month, please take 1 tablet of cetirizine and 1 tablet of montelukast at bedtime every night.  After 1 month, you can discontinue.  If the itching reappears, even if only slightly, please resume the cetirizine and montelukast at bedtime.  If this does not keep it under control, you can return for repeat injection of methylprednisolone.  For your longstanding rash, tinea corporis, which is causing the discoloration of your skin, please begin using selenium sulfide shampoo.  Please apply the shampoo to wet skin in the affected areas, lather and leave on the skin for 10 minutes then rinse.  Please do this every day for 7 days.  After showering, please apply ketoconazole cream to the affected and immediate surrounding areas (about 1 inch beyond any signs of discoloration) daily for 3 weeks.  Finally take 1 tablet of Diflucan by mouth once weekly for 4 doses.  I recommend that you begin your first dose you begin the shampoo and ketoconazole cream.  Thank you for visiting urgent care today, I appreciate the opportunity to participate in your care.  Please return for repeat evaluation if you have not had complete relief of your symptoms in the next month.      This office note has been dictated using Museum/gallery curator.  Unfortunately, and despite my best efforts, this method of dictation can sometimes lead to occasional typographical or grammatical errors.  I apologize in advance if this occurs.     Lynden Oxford Scales, PA-C 01/22/21 1133

## 2021-01-22 NOTE — Discharge Instructions (Addendum)
For your acute itching and urticaria, you received an injection of methylprednisolone in the office today.  This is a strong steroid and should significantly reduce your itching within the next 2 to 3 hours.  Tomorrow morning, please begin a descending dose of methylprednisolone by taking 1 row of tablets and the steroid Dosepak each day with your breakfast meal.  To keep the urticaria completely under control for the next month, please take 1 tablet of cetirizine and 1 tablet of montelukast at bedtime every night.  After 1 month, you can discontinue.  If the itching reappears, even if only slightly, please resume the cetirizine and montelukast at bedtime.  If this does not keep it under control, you can return for repeat injection of methylprednisolone.  For your longstanding rash, tinea corporis, which is causing the discoloration of your skin, please begin using selenium sulfide shampoo.  Please apply the shampoo to wet skin in the affected areas, lather and leave on the skin for 10 minutes then rinse.  Please do this every day for 7 days.  After showering, please apply ketoconazole cream to the affected and immediate surrounding areas (about 1 inch beyond any signs of discoloration) daily for 3 weeks.  Finally take 1 tablet of Diflucan by mouth once weekly for 4 doses.  I recommend that you begin your first dose you begin the shampoo and ketoconazole cream.  Thank you for visiting urgent care today, I appreciate the opportunity to participate in your care.  Please return for repeat evaluation if you have not had complete relief of your symptoms in the next month.

## 2021-03-26 ENCOUNTER — Ambulatory Visit (HOSPITAL_COMMUNITY)
Admission: EM | Admit: 2021-03-26 | Discharge: 2021-03-26 | Disposition: A | Discharge status: Home / Self Care | Payer: 59 | Attending: Family Medicine | Admitting: Family Medicine

## 2021-03-26 ENCOUNTER — Encounter (HOSPITAL_COMMUNITY): Payer: Self-pay | Admitting: Emergency Medicine

## 2021-03-26 ENCOUNTER — Other Ambulatory Visit: Payer: Self-pay

## 2021-03-26 DIAGNOSIS — R21 Rash and other nonspecific skin eruption: Secondary | ICD-10-CM

## 2021-03-26 MED ORDER — MONTELUKAST SODIUM 10 MG PO TABS: 10.0000 mg | ORAL_TABLET | Freq: Every day | ORAL | 2 refills | Status: DC

## 2021-03-26 MED ORDER — CETIRIZINE HCL 10 MG PO TABS: 10.0000 mg | ORAL_TABLET | Freq: Every day | ORAL | 2 refills | Status: DC

## 2021-03-26 NOTE — Discharge Instructions (Addendum)
Continue cetirizine 10 mg--1 every evening for allergies ? ?Continue montelukast 10 mg--1 every evening for allergies ? ?Keep your appointment with your primary care doctor for May. ?

## 2021-03-26 NOTE — ED Triage Notes (Signed)
2 month history of a rash.  Has been seen before and treated.  Patient does not know diagnosis.  Reports random areas of rash on body, itches.  Patient reports it is not nearly as bad as when evaluated before ?

## 2021-03-26 NOTE — ED Provider Notes (Signed)
?[facility] ? ? ? ?CSN: [id number] ?Arrival date & time: 03/26/21  1007 ? ? ?  ? ?History   ?Chief Complaint ?Chief Complaint  ?Patient presents with  ? Rash  ? ? ?HPI ?Adam Duarte is a 26 y.o. male.  ? ? ?Rash ?Here for a 32-month history of rash he was prescribed as Zyrtec and Singulair when last here, and it helped a lot.  He has been taking these 2 medications every evening for the most part.  If he does miss a dose he feels the rash start back.  He wants to make sure it is okay for him to take these chronically he is set up with his primary physician for an appointment in May. ? ?No fever or cough and no rash currently ? ?History reviewed. No pertinent past medical history. ? ?Patient Active Problem List  ? Diagnosis Date Noted  ? Tinea versicolor 10/15/2019  ? Nausea without vomiting 10/15/2019  ? Decreased hearing of both ears 10/15/2019  ? ? ?History reviewed. No pertinent surgical history. ? ? ? ? ?Home Medications   ? ?Prior to Admission medications   ?Medication Sig Start Date End Date Taking? Authorizing Provider  ?cetirizine (ZYRTEC ALLERGY) 10 MG tablet Take 1 tablet (10 mg total) by mouth at bedtime. 03/26/21  Yes Barrett Henle, MD  ?montelukast (SINGULAIR) 10 MG tablet Take 1 tablet (10 mg total) by mouth at bedtime. 03/26/21  Yes Barrett Henle, MD  ? ? ?Family History ?Family History  ?Problem Relation Age of Onset  ? Hyperlipidemia Father   ? Diabetes Maternal Grandmother   ? ? ?Social History ?Social History  ? ?Tobacco Use  ? Smoking status: Every Day  ? Smokeless tobacco: Never  ? Tobacco comments:  ?  hooka  ?Vaping Use  ? Vaping Use: Never used  ?Substance Use Topics  ? Alcohol use: Yes  ?  Comment: twice a month  ? Drug use: Never  ? ? ? ?Allergies   ?Patient has no known allergies. ? ? ?Review of Systems ?Review of Systems  ?Skin:  Positive for rash.  ? ? ?Physical Exam ?Triage Vital Signs ?ED Triage Vitals  ?Enc Vitals Group  ?   BP 03/26/21 1027 133/85  ?   Pulse Rate  03/26/21 1027 60  ?   Resp 03/26/21 1027 16  ?   Temp 03/26/21 1027 98.5 ?F (36.9 ?C)  ?   Temp Source 03/26/21 1027 Oral  ?   SpO2 03/26/21 1027 99 %  ?   Weight --   ?   Height --   ?   Head Circumference --   ?   Peak Flow --   ?   Pain Score 03/26/21 1024 0  ?   Pain Loc --   ?   Pain Edu? --   ?   Excl. in [place]? --   ? ?No data found. ? ?Updated Vital Signs ?BP 133/85 (BP Location: Right Arm)   Pulse 60   Temp 98.5 ?F (36.9 ?C) (Oral)   Resp 16   SpO2 99%  ? ?Visual Acuity ?Right Eye Distance:   ?Left Eye Distance:   ?Bilateral Distance:   ? ?Right Eye Near:   ?Left Eye Near:    ?Bilateral Near:    ? ?Physical Exam ?Vitals reviewed.  ?Constitutional:   ?   General: He is not in acute distress. ?   Appearance: He is not toxic-appearing.  ?HENT:  ?   Mouth/Throat:  ?  Mouth: Mucous membranes are moist.  ?Eyes:  ?   Extraocular Movements: Extraocular movements intact.  ?   Pupils: Pupils are equal, round, and reactive to light.  ?Cardiovascular:  ?   Rate and Rhythm: Normal rate and regular rhythm.  ?   Heart sounds: No murmur heard. ?Pulmonary:  ?   Effort: Pulmonary effort is normal.  ?   Breath sounds: Normal breath sounds.  ?Musculoskeletal:  ?   Cervical back: Neck supple.  ?Lymphadenopathy:  ?   Cervical: No cervical adenopathy.  ?Skin: ?   Findings: No rash.  ?Neurological:  ?   Mental Status: He is oriented to person, place, and time.  ?Psychiatric:     ?   Behavior: Behavior normal.  ? ? ? ?UC Treatments / Results  ?Labs ?(all labs ordered are listed, but only abnormal results are displayed) ?Labs Reviewed - No data to display ? ?EKG ? ? ?Radiology ?No results found. ? ?Procedures ?Procedures (including critical care time) ? ?Medications Ordered in UC ?Medications - No data to display ? ?Initial Impression / Assessment and Plan / UC Course  ?I have reviewed the triage vital signs and the nursing notes. ? ?Pertinent labs & imaging results that were available during my care of the patient were reviewed  by me and considered in my medical decision making (see chart for details). ? ?  ? ?Discussed that it is okay to take both the Zyrtec and the Singulair nightly if needed.  He may not need it after the spring blooming season is done.  Keep his appointment with his PCP.  I will refill his 2 medications today for about 3 months ? ?Visit was conducted in [place], with good communication accomplished(he declined video interpretation services) ?Final Clinical Impressions(s) / UC Diagnoses  ? ?Final diagnoses:  ?Rash and nonspecific skin eruption  ? ? ? ?Discharge Instructions   ? ?  ?Continue cetirizine 10 mg--1 every evening for allergies ? ?Continue montelukast 10 mg--1 every evening for allergies ? ?Keep your appointment with your primary care doctor for May. ? ? ? ? ?ED Prescriptions   ? ? Medication Sig Dispense Auth. Provider  ? cetirizine (ZYRTEC ALLERGY) 10 MG tablet Take 1 tablet (10 mg total) by mouth at bedtime. 30 tablet Barrett Henle, MD  ? montelukast (SINGULAIR) 10 MG tablet Take 1 tablet (10 mg total) by mouth at bedtime. 30 tablet Barrett Henle, MD  ? ?  ? ?PDMP not reviewed this encounter. ?  ?Barrett Henle, MD ?03/26/21 1044 ? ?

## 2021-03-30 ENCOUNTER — Telehealth: Payer: Self-pay

## 2021-03-30 DIAGNOSIS — Z0000 Encounter for general adult medical examination without abnormal findings: Secondary | ICD-10-CM

## 2021-03-30 DIAGNOSIS — Z13220 Encounter for screening for lipoid disorders: Secondary | ICD-10-CM

## 2021-03-30 DIAGNOSIS — Z130 Encounter for screening for diseases of the blood and blood-forming organs and certain disorders involving the immune mechanism: Secondary | ICD-10-CM

## 2021-03-30 DIAGNOSIS — R739 Hyperglycemia, unspecified: Secondary | ICD-10-CM

## 2021-03-30 NOTE — Telephone Encounter (Signed)
Labs ordered, pt called and scheduled for lab appt.  ? ?

## 2021-03-30 NOTE — Telephone Encounter (Signed)
Patient is scheduled for a CPE on 4/27 and is asking if he can come prior to his appointment to do lab work so they can discuss it at his appointment. Adam Duarte [place] a few days after his appointment.  ?

## 2021-04-12 ENCOUNTER — Other Ambulatory Visit: Payer: 59

## 2021-04-27 ENCOUNTER — Other Ambulatory Visit (INDEPENDENT_AMBULATORY_CARE_PROVIDER_SITE_OTHER): Payer: 59

## 2021-04-27 ENCOUNTER — Other Ambulatory Visit (HOSPITAL_COMMUNITY)
Admission: RE | Admit: 2021-04-27 | Discharge: 2021-04-27 | Disposition: A | Discharge status: Home / Self Care | Payer: 59 | Source: Ambulatory Visit | Attending: Family Medicine | Admitting: Family Medicine

## 2021-04-27 ENCOUNTER — Ambulatory Visit (INDEPENDENT_AMBULATORY_CARE_PROVIDER_SITE_OTHER): Payer: 59 | Admitting: Family Medicine

## 2021-04-27 VITALS — BP 122/78 | HR 99 | Temp 98.0°F | Resp 16 | Ht 65.0 in | Wt 136.0 lb

## 2021-04-27 DIAGNOSIS — Z0001 Encounter for general adult medical examination with abnormal findings: Secondary | ICD-10-CM

## 2021-04-27 DIAGNOSIS — Z13220 Encounter for screening for lipoid disorders: Secondary | ICD-10-CM

## 2021-04-27 DIAGNOSIS — Z113 Encounter for screening for infections with a predominantly sexual mode of transmission: Secondary | ICD-10-CM

## 2021-04-27 DIAGNOSIS — L509 Urticaria, unspecified: Secondary | ICD-10-CM | POA: Diagnosis not present

## 2021-04-27 DIAGNOSIS — R739 Hyperglycemia, unspecified: Secondary | ICD-10-CM

## 2021-04-27 DIAGNOSIS — Z0000 Encounter for general adult medical examination without abnormal findings: Secondary | ICD-10-CM

## 2021-04-27 DIAGNOSIS — Z130 Encounter for screening for diseases of the blood and blood-forming organs and certain disorders involving the immune mechanism: Secondary | ICD-10-CM

## 2021-04-27 DIAGNOSIS — B360 Pityriasis versicolor: Secondary | ICD-10-CM | POA: Diagnosis not present

## 2021-04-27 LAB — CBC WITH DIFFERENTIAL/PLATELET
Basophils Absolute: 0 10*3/uL (ref 0.0–0.1)
Basophils Relative: 0.7 % (ref 0.0–3.0)
Eosinophils Absolute: 0 10*3/uL (ref 0.0–0.7)
Eosinophils Relative: 0.8 % (ref 0.0–5.0)
HCT: 41.3 % (ref 39.0–52.0)
Hemoglobin: 14.5 g/dL (ref 13.0–17.0)
Lymphocytes Relative: 39.1 % (ref 12.0–46.0)
Lymphs Abs: 1.7 10*3/uL (ref 0.7–4.0)
MCHC: 35.1 g/dL (ref 30.0–36.0)
MCV: 82 fl (ref 78.0–100.0)
Monocytes Absolute: 0.4 10*3/uL (ref 0.1–1.0)
Monocytes Relative: 8.3 % (ref 3.0–12.0)
Neutro Abs: 2.3 10*3/uL (ref 1.4–7.7)
Neutrophils Relative %: 51.1 % (ref 43.0–77.0)
Platelets: 254 10*3/uL (ref 150.0–400.0)
RBC: 5.04 Mil/uL (ref 4.22–5.81)
RDW: 13.7 % (ref 11.5–15.5)
WBC: 4.5 10*3/uL (ref 4.0–10.5)

## 2021-04-27 LAB — COMPREHENSIVE METABOLIC PANEL
ALT: 20 U/L (ref 0–53)
AST: 17 U/L (ref 0–37)
Albumin: 4.3 g/dL (ref 3.5–5.2)
Alkaline Phosphatase: 104 U/L (ref 39–117)
BUN: 10 mg/dL (ref 6–23)
CO2: 30 mEq/L (ref 19–32)
Calcium: 8.9 mg/dL (ref 8.4–10.5)
Chloride: 104 mEq/L (ref 96–112)
Creatinine, Ser: 0.8 mg/dL (ref 0.40–1.50)
GFR: 120.6 mL/min (ref >60.00)
Glucose, Bld: 96 mg/dL (ref 70–99)
Potassium: 4.2 mEq/L (ref 3.5–5.1)
Sodium: 138 mEq/L (ref 135–145)
Total Bilirubin: 0.5 mg/dL (ref 0.2–1.2)
Total Protein: 7.1 g/dL (ref 6.0–8.3)

## 2021-04-27 LAB — LIPID PANEL
Cholesterol: 143 mg/dL (ref 0–200)
HDL: 51 mg/dL (ref >39.00)
LDL Cholesterol: 81 mg/dL (ref 0–99)
NonHDL: 92.37
Total CHOL/HDL Ratio: 3
Triglycerides: 56 mg/dL (ref 0.0–149.0)
VLDL: 11.2 mg/dL (ref 0.0–40.0)

## 2021-04-27 LAB — HEMOGLOBIN A1C: Hgb A1c MFr Bld: 4.9 % (ref 4.6–6.5)

## 2021-04-27 MED ORDER — KETOCONAZOLE 200 MG PO TABS: 400.0000 mg | ORAL_TABLET | Freq: Once | ORAL | 0 refills | Status: AC

## 2021-04-27 MED ORDER — CETIRIZINE HCL 10 MG PO TABS: 10.0000 mg | ORAL_TABLET | Freq: Every day | ORAL | 2 refills | Status: DC

## 2021-04-27 MED ORDER — MONTELUKAST SODIUM 10 MG PO TABS: 10.0000 mg | ORAL_TABLET | Freq: Every day | ORAL | 1 refills | Status: DC

## 2021-04-27 NOTE — Progress Notes (Signed)
? ?Subjective:  ?Patient ID: Adam Duarte, male    DOB: 05/31/1995  Age: 26 y.o. MRN: [id number] ? ?CC:  ?Chief Complaint  ?Patient presents with  ? Annual Exam  ?  Patient states he is here for CPE.  ? ? ?HPI ?Adam LarsenAbubker Winters presents for Annual Exam ? ?Last physical in September 2021.  Concerns of hearing, skin discoloration, nausea were discussed at that time.  Referred to audiologist for hearing testing, and hearing protection discussed with loud noise.  Treated with Nizoral for tinea versicolor, ultrasound of gallbladder and labs obtained for nausea with plan for recheck in 1 month.  ? ?Seen by my colleague in follow-up in October 2021.  Hearing test was ok - using ear protection with noise, and hearing is doing well.  ?Symptoms were improved in regards to the nausea and declined GI eval.  Right upper quadrant ultrasound in October 2021 was normal. No further nausea.  ? ?Referred to dermatology 10/2019 for persistent rash.  No change with Nizoral per October 2021 note. Did not see dermatology. Agrees to try one more attempt at nizoral, then possible derm referral. No relief with selenium sulfide at urgent care.  ? ?Initially treated for urticaria January 22 at with methylprednisolone injection, Medrol Dosepak, Singulair and cetirizine. Selenium sulfide shampoo was recommended at that visit as well for tinea rash. ?Urgent care note reviewed from March 26 with rash.  Previously been treated with Zyrtec and Singulair which helped for urticaria, with recurrence of symptoms off meds - notes slight itching if come off meds, better taking both - has not tried just one. Tried salmon and shrimp, used nicotine prior to hives - stopped all 3. ?Sometimes rash. Line noted after scratching area.  ?No recent unprotected intercourse, but requests sti screening.  ? ?Hyperglycemia: ?Discussed in 2021. hyperglycemia with planned recheck labs in 3 to 6 months. ?Lab Results  ?Component Value Date  ? HGBA1C 4.9 04/27/2021   ? ?Glucose 101 in 09/2019.  ? ? ? ?  04/27/2021  ?  2:10 PM 10/15/2019  ?  2:53 PM 09/18/2019  ?  3:26 PM 09/18/2019  ?  3:25 PM 07/07/2018  ?  3:28 PM  ?Depression screen PHQ 2/9  ?Decreased Interest 0 0 0 0 0  ?Down, Depressed, Hopeless 0 0 0 0 0  ?PHQ - 2 Score 0 0 0 0 0  ?Altered sleeping 0      ?Tired, decreased energy 0      ?Change in appetite 0      ?Feeling bad or failure about yourself  0      ?Trouble concentrating 0      ?Moving slowly or fidgety/restless 0      ?Suicidal thoughts 0      ?PHQ-9 Score 0      ?Difficult doing work/chores Not difficult at all      ? ? ?Health Maintenance  ?Topic Date Due  ? COVID-19 Vaccine (2 - Booster for Janssen series) 05/13/2021 (Originally 09/12/2019)  ? Hepatitis C Screening  04/28/2022 (Originally 01/02/2011)  ? INFLUENZA VACCINE  08/01/2021  ? TETANUS/TDAP  09/17/2029  ? HIV Screening  Completed  ? HPV VACCINES  Aged Out  ? ? ?Immunization History  ?Administered Date(s) Administered  ? Influenza,inj,Quad PF,6+ Mos 09/26/2017, 09/18/2019  ? Janssen (J&J) SARS-COV-2 Vaccination 07/18/2019  ? Tdap 09/18/2019  ? ? ?No results found. ?Wears glasses, optho few days ago.  ? ?Dental:Yes - appt yesterday ? ?Alcohol: rare - less than drink in 6  months.  ? ?Tobacco: hookah prior - quit.  ? ?Exercise:no regular exercise.  ?Physical work - Games developer.  ? ?History ?Patient Active Problem List  ? Diagnosis Date Noted  ? Tinea versicolor 10/15/2019  ? Nausea without vomiting 10/15/2019  ? Decreased hearing of both ears 10/15/2019  ? ?No past medical history on file. ?No past surgical history on file. ?No Known Allergies ?Prior to Admission medications   ?Medication Sig Start Date End Date Taking? Authorizing Provider  ?cetirizine (ZYRTEC ALLERGY) 10 MG tablet Take 1 tablet (10 mg total) by mouth at bedtime. 03/26/21  Yes Zenia Resides, MD  ?montelukast (SINGULAIR) 10 MG tablet Take 1 tablet (10 mg total) by mouth at bedtime. 03/26/21  Yes Zenia Resides, MD  ? ?Social History   ? ?Socioeconomic History  ? Marital status: Single  ?  Spouse name: Not on file  ? Number of children: Not on file  ? Years of education: Not on file  ? Highest education level: Not on file  ?Occupational History  ? Not on file  ?Tobacco Use  ? Smoking status: Every Day  ? Smokeless tobacco: Never  ? Tobacco comments:  ?  hooka  ?Vaping Use  ? Vaping Use: Never used  ?Substance and Sexual Activity  ? Alcohol use: Yes  ?  Comment: twice a month  ? Drug use: Never  ? Sexual activity: Yes  ?Other Topics Concern  ? Not on file  ?Social History Narrative  ? Not on file  ? ?Social Determinants of Health  ? ?Financial Resource Strain: Not on file  ?Food Insecurity: Not on file  ?Transportation Needs: Not on file  ?Physical Activity: Not on file  ?Stress: Not on file  ?Social Connections: Not on file  ?Intimate Partner Violence: Not on file  ? ? ?Review of Systems ?13 point review of systems per patient health survey noted.  Negative other than as indicated above or in HPI.  ? ? ?Objective:  ? ?Vitals:  ? 04/27/21 1408  ?BP: 122/78  ?Pulse: 99  ?Resp: 16  ?Temp: 98 ?F (36.7 ?C)  ?TempSrc: Temporal  ?SpO2: 100%  ?Weight: 136 lb (61.7 kg)  ?Height: 5\' 5"  (1.651 m)  ? ? ? ?Physical Exam ?Vitals reviewed.  ?Constitutional:   ?   Appearance: He is well-developed.  ?HENT:  ?   Head: Normocephalic and atraumatic.  ?   Right Ear: External ear normal.  ?   Left Ear: External ear normal.  ?Eyes:  ?   Conjunctiva/sclera: Conjunctivae normal.  ?   Pupils: Pupils are equal, round, and reactive to light.  ?Neck:  ?   Thyroid: No thyromegaly.  ?Cardiovascular:  ?   Rate and Rhythm: Normal rate and regular rhythm.  ?   Heart sounds: Normal heart sounds.  ?Pulmonary:  ?   Effort: Pulmonary effort is normal. No respiratory distress.  ?   Breath sounds: Normal breath sounds. No wheezing.  ?Abdominal:  ?   General: There is no distension.  ?   Palpations: Abdomen is soft.  ?   Tenderness: There is no abdominal tenderness.  ?Musculoskeletal:      ?   General: No tenderness. Normal range of motion.  ?   Cervical back: Normal range of motion and neck supple.  ?Lymphadenopathy:  ?   Cervical: No cervical adenopathy.  ?Skin: ?   General: Skin is warm and dry.  ?Neurological:  ?   Mental Status: He is alert and oriented to person,  place, and time.  ?   Deep Tendon Reflexes: Reflexes are normal and symmetric.  ?Psychiatric:     ?   Behavior: Behavior normal.  ? ? ? ? ? ?Assessment & Plan:  ?Shaheed Schmuck is a 26 y.o. male . ?Annual physical exam ? --anticipatory guidance as below in AVS, screening labs above. Health maintenance items as above in HPI discussed/recommended as applicable.  ? ?Tinea versicolor - Plan: ketoconazole (NIZORAL) 200 MG tablet ? -Minimal relief with initial Nizoral trial and no relief with selenium sulfide.  Options discussed, but he would like to try ketoconazole 1 more time.  40 mg x 1 with technique discussed.  If persistent rash can evaluate with dermatology. ? ?Urticaria - Plan: Ambulatory referral to Allergy, cetirizine (ZYRTEC ALLERGY) 10 MG tablet, montelukast (SINGULAIR) 10 MG tablet ? -Urticaria versus component of dermatographia some.  Continue Zyrtec, option to try off Singulair.  Refer to allergist to evaluate for possible allergy testing and trigger.  RTC/ER precautions given. ? ?Routine screening for STI (sexually transmitted infection) - Plan: Urine cytology ancillary only, HIV Antibody (routine testing w rflx), RPR, Urine cytology ancillary only, CANCELED: HIV Antibody (routine testing w rflx), CANCELED: RPR, CANCELED: Urine cytology ancillary only ? ?Hyperglycemia - Plan: Hemoglobin A1c, Comprehensive metabolic panel, CANCELED: Hemoglobin A1c, CANCELED: Comprehensive metabolic panel ? -Mild elevation previously.  Check A1c. ? ?Meds ordered this encounter  ?Medications  ? cetirizine (ZYRTEC ALLERGY) 10 MG tablet  ?  Sig: Take 1 tablet (10 mg total) by mouth at bedtime.  ?  Dispense:  90 tablet  ?  Refill:  2  ?  montelukast (SINGULAIR) 10 MG tablet  ?  Sig: Take 1 tablet (10 mg total) by mouth at bedtime.  ?  Dispense:  90 tablet  ?  Refill:  1  ? ketoconazole (NIZORAL) 200 MG tablet  ?  Sig: Take 2 tablets (400 mg total

## 2021-04-27 NOTE — Patient Instructions (Addendum)
[facility] - make sure to be well hydrated before that bloodwork.  ?Walk in 8:30-4:30 during weekdays, no appointment needed ?[address]  ?[place], [place] [id number] ? ? ?I will refer you to allergist for possible allergy testing. Continue cetirizine and singulair for now, but you might be able to just take cetirizine alone.  ?Try nizoral pills for chest rash once more, then if not better I can refer you to dermatology.  ?Take care! ?Preventive Care 28-40 Years Old, Male ?Preventive care refers to lifestyle choices and visits with your health care provider that can promote health and wellness. Preventive care visits are also called wellness exams. ?What can I expect for my preventive care visit? ?Counseling ?During your preventive care visit, your health care provider may ask about your: ?Medical history, including: ?Past medical problems. ?Family medical history. ?Current health, including: ?Emotional well-being. ?Home life and relationship well-being. ?Sexual activity. ?Lifestyle, including: ?Alcohol, nicotine or tobacco, and drug use. ?Access to firearms. ?Diet, exercise, and sleep habits. ?Safety issues such as seatbelt and bike helmet use. ?Sunscreen use. ?Work and work Statistician. ?Physical exam ?Your health care provider may check your: ?Height and weight. These may be used to calculate your BMI (body mass index). BMI is a measurement that tells if you are at a healthy weight. ?Waist circumference. This measures the distance around your waistline. This measurement also tells if you are at a healthy weight and may help predict your risk of certain diseases, such as type 2 diabetes and high blood pressure. ?Heart rate and blood pressure. ?Body temperature. ? ?Vaccines are usually given at various ages, according to a schedule. Your health care provider will recommend vaccines for you based on your age, medical history, and lifestyle or other factors, such as travel or where you work. ?What tests do I  need? ?Screening ?Your health care provider may recommend screening tests for certain conditions. This may include: ?Lipid and cholesterol levels. ?Diabetes screening. This is done by checking your blood sugar (glucose) after you have not eaten for a while (fasting). ?Hepatitis B test. ?Hepatitis C test. ?HIV (human immunodeficiency virus) test. ?STI (sexually transmitted infection) testing, if you are at risk. ?Talk with your health care provider about your test results, treatment options, and if necessary, the need for more tests. ?Follow these instructions at home: ?Eating and drinking ? ?Eat a healthy diet that includes fresh fruits and vegetables, whole grains, lean protein, and low-fat dairy products. ?Drink enough fluid to keep your urine pale yellow. ?Take vitamin and mineral supplements as recommended by your health care provider. ?Do not drink alcohol if your health care provider tells you not to drink. ?If you drink alcohol: ?Limit how much you have to 0-2 drinks a day. ?Know how much alcohol is in your drink. In the [place] one drink equals one 12 oz bottle of beer (355 mL), one 5 oz glass of wine (148 mL), or one 1? oz glass of hard liquor (44 mL). ?Lifestyle ?Brush your teeth every morning and night with fluoride toothpaste. Floss one time each day. ?Exercise for at least 30 minutes 5 or more days each week. ?Do not use any products that contain nicotine or tobacco. These products include cigarettes, chewing tobacco, and vaping devices, such as e-cigarettes. If you need help quitting, ask your health care provider. ?Do not use drugs. ?If you are sexually active, practice safe sex. Use a condom or other form of protection to prevent STIs. ?Find healthy ways to manage stress,  such as: ?Meditation, yoga, or listening to music. ?Journaling. ?Talking to a trusted person. ?Spending time with friends and family. ?Minimize exposure to UV radiation to reduce your risk of skin cancer. ?Safety ?Always wear your  seat belt while driving or riding in a vehicle. ?Do not drive: ?If you have been drinking alcohol. Do not ride with someone who has been drinking. ?If you have been using any mind-altering substances or drugs. ?While texting. ?When you are tired or distracted. ?Wear a helmet and other protective equipment during sports activities. ?If you have firearms in your house, make sure you follow all gun safety procedures. ?Seek help if you have been physically or sexually abused. ?What's next? ?Go to your health care provider once a year for an annual wellness visit. ?Ask your health care provider how often you should have your eyes and teeth checked. ?Stay up to date on all vaccines. ?This information is not intended to replace advice given to you by your health care provider. Make sure you discuss any questions you have with your health care provider. ?Document Revised: 06/15/2020 Document Reviewed: 06/15/2020 ?Elsevier Patient Education ? [place]. ? ? ? ?If you have lab work done today you will be contacted with your lab results within the next 2 weeks.  If you have not heard from [place] then please contact us. The fastest way to get your results is to register for My Chart. ? ? ?IF you received an x-ray today, you will receive an invoice from [facility]. Please contact [facility] at [phone] with questions or concerns regarding your invoice.  ? ?IF you received labwork today, you will receive an invoice from [facility]. Please contact [facility] at [phone] with questions or concerns regarding your invoice.  ? ?Our billing staff will not be able to assist you with questions regarding bills from these companies. ? ?You will be contacted with the lab results as soon as they are available. The fastest way to get your results is to activate your My Chart account. Instructions are located on the last page of this paperwork. If you have not heard from [place] regarding the results in 2 weeks, please  contact this office. ?  ? ? ?

## 2021-04-28 LAB — RPR: RPR Ser Ql: NONREACTIVE

## 2021-04-28 LAB — HIV ANTIBODY (ROUTINE TESTING W REFLEX): HIV 1&2 Ab, 4th Generation: NONREACTIVE

## 2021-05-01 ENCOUNTER — Encounter: Payer: 59 | Admitting: Family Medicine

## 2021-05-02 LAB — URINE CYTOLOGY ANCILLARY ONLY
Chlamydia: NEGATIVE
Comment: NEGATIVE
Comment: NEGATIVE
Comment: NORMAL
Neisseria Gonorrhea: NEGATIVE
Trichomonas: NEGATIVE

## 2021-08-02 NOTE — Progress Notes (Unsigned)
New Patient Note  RE: [name] MRN: [id number] DOB: 1995/02/10 Date of Office Visit: 08/03/2021  Consult requested by: Shade Flood, MD Primary care provider: Shade Flood, MD  Chief Complaint: Pruritus (Since Jan. Has been itchy. Has been taking zyrtec for it. Is tired of taking zyrtec. Has been changing a lot in his lifestyle and can not figure it out. )  History of Present Illness: I had the pleasure of seeing Adam Duarte for initial evaluation at the [facility] on 08/03/2021. He is a 26 y.o. male, who is referred here by Shade Flood, MD for the evaluation of rash.  Itching started in January 2023. This can occur anywhere on his body. Describes them as itchy, raised and red. Individual rashes lasts about less than 1 hour. No ecchymosis upon resolution. Associated symptoms include: none.   Frequency of episodes: daily if not on antihistamines.  Suspected triggers are unknown. Denies any fevers, chills, changes in medications, foods, personal care products or recent infections. He has tried the following therapies: zyrtec with good benefit. Systemic steroids yes. Currently on zyrtec 10mg  daily and Singulair 10mg  daily.  Previous work up includes: denies. Previous history of rash/hives: no. Patient is up to date with the following cancer screening tests: physical exam.  Reviewed picture on the phone - linear urticaria noted.   04/27/2021 PCP visit: "Referred to dermatology 10/2019 for persistent rash.  No change with Nizoral per October 2021 note. Did not see dermatology. Agrees to try one more attempt at nizoral, then possible derm referral. No relief with selenium sulfide at urgent care.    Initially treated for urticaria January 22 at with methylprednisolone injection, Medrol Dosepak, Singulair and cetirizine. Selenium sulfide shampoo was recommended at that visit as well for tinea rash. Urgent care note reviewed from March 26 with rash.   Previously been treated with Zyrtec and Singulair which helped for urticaria, with recurrence of symptoms off meds - notes slight itching if come off meds, better taking both - has not tried just one. Tried salmon and shrimp, used nicotine prior to hives - stopped all 3. Sometimes rash. Line noted after scratching area.  No recent unprotected intercourse, but requests sti screening."  Assessment and Plan: Trevian is a 26 y.o. male with: Dermatographic urticaria Pruritic rash x 8 months lasting less than 1 hour at a time. Improved with daily zyrtec and Singulair but concerned about taking meds for long term. Denies any changes in diet, meds, personal care products or recent infections. No triggers noted. Denies any other associated symptoms. Tried to eliminate various foods and smoking with no improvement.  Patient had +2 dermatographism on exam today. Discussed with patient that he has dermatographism and unable to skin prick test today as everything will show up due to his dermatographism. Dermatographism is not triggered by any allergies but it's caused by excess histamine cells of the skin. Continue taking zyrtec (cetirizine) 10mg  daily to control symptoms. May take twice a day if needed. See below for proper skin care. Avoid the following potential triggers: alcohol, tight clothing, NSAIDs, hot showers and getting overheated. Get bloodwork as below to rule out other etiologies.  Return in about 3 months (around 11/03/2021).  No orders of the defined types were placed in this encounter.  Lab Orders         Alpha-Gal Panel         ANA w/Reflex         CBC with  Differential/Platelet         Chronic Urticaria         C3 and C4         Comprehensive metabolic panel         C-reactive protein         Sedimentation rate         Tryptase         Allergens w/Total IgE Area 2         Thyroid Cascade Profile      Other allergy screening: Asthma: no Rhino conjunctivitis: no Food allergy:  no Medication allergy: no Hymenoptera allergy: no Eczema:no History of recurrent infections suggestive of immunodeficency: no  Diagnostics: None.   Past Medical History: Patient Active Problem List   Diagnosis Date Noted   Dermatographic urticaria 08/03/2021   Tinea versicolor 10/15/2019   Nausea without vomiting 10/15/2019   Decreased hearing of both ears 10/15/2019   History reviewed. No pertinent past medical history. Past Surgical History: History reviewed. No pertinent surgical history. Medication List:  Current Outpatient Medications  Medication Sig Dispense Refill   cetirizine (ZYRTEC ALLERGY) 10 MG tablet Take 1 tablet (10 mg total) by mouth at bedtime. 90 tablet 2   No current facility-administered medications for this visit.   Allergies: No Known Allergies Social History: Social History   Socioeconomic History   Marital status: Single    Spouse name: Not on file   Number of children: Not on file   Years of education: Not on file   Highest education level: Not on file  Occupational History   Not on file  Tobacco Use   Smoking status: Every Day   Smokeless tobacco: Never   Tobacco comments:    hooka  Vaping Use   Vaping Use: Never used  Substance and Sexual Activity   Alcohol use: Yes    Comment: twice a month   Drug use: Never   Sexual activity: Yes  Other Topics Concern   Not on file  Social History Narrative   Not on file   Social Determinants of Health   Financial Resource Strain: Not on file  Food Insecurity: Not on file  Transportation Needs: Not on file  Physical Activity: Not on file  Stress: Not on file  Social Connections: Not on file   Lives in a house. Smoking: yes Occupation: Medical illustrator History: Water Damage/mildew in the house: no Engineer, civil (consulting) in the family room: no Carpet in the bedroom: no Heating: gas Cooling: central Pet: no  Family History: Family History  Problem Relation Age of Onset    Hyperlipidemia Father    Diabetes Maternal Grandmother    Allergic rhinitis Neg Hx    Angioedema Neg Hx    Asthma Neg Hx    Eczema Neg Hx    Urticaria Neg Hx    Review of Systems  Constitutional:  Negative for appetite change, chills, fever and unexpected weight change.  HENT:  Negative for congestion and rhinorrhea.   Eyes:  Negative for itching.  Respiratory:  Negative for cough, chest tightness, shortness of breath and wheezing.   Cardiovascular:  Negative for chest pain.  Gastrointestinal:  Negative for abdominal pain.  Genitourinary:  Negative for difficulty urinating.  Skin:  Positive for rash.  Neurological:  Negative for headaches.    Objective: BP (!) 124/90   Pulse 89   Temp 98.7 F (37.1 C)   Resp 18   Ht 5' 5.75" (1.67 m)   Wt 129 lb  12 oz (58.9 kg)   SpO2 98%   BMI 21.10 kg/m  Body mass index is 21.1 kg/m. Physical Exam Vitals and nursing note reviewed.  Constitutional:      Appearance: Normal appearance. He is well-developed.  HENT:     Head: Normocephalic and atraumatic.     Right Ear: Tympanic membrane and external ear normal.     Left Ear: Tympanic membrane and external ear normal.     Nose: Nose normal.     Mouth/Throat:     Mouth: Mucous membranes are moist.     Pharynx: Oropharynx is clear.  Eyes:     Conjunctiva/sclera: Conjunctivae normal.  Cardiovascular:     Rate and Rhythm: Normal rate and regular rhythm.     Heart sounds: Normal heart sounds. No murmur heard.    No friction rub. No gallop.  Pulmonary:     Effort: Pulmonary effort is normal.     Breath sounds: Normal breath sounds. No wheezing, rhonchi or rales.  Musculoskeletal:     Cervical back: Neck supple.  Skin:    General: Skin is warm.     Findings: Rash present.     Comments: +2 dermatographism  Neurological:     Mental Status: He is alert and oriented to person, place, and time.  Psychiatric:        Behavior: Behavior normal.   The plan was reviewed with the  patient/family, and all questions/concerned were addressed.  It was my pleasure to see Adam Duarte today and participate in his care. Please feel free to contact me with any questions or concerns.  Sincerely,  Wyline Mood, DO Allergy & Immunology  [facility] office: [phone] [facility] office: [phone]

## 2021-08-03 ENCOUNTER — Ambulatory Visit (INDEPENDENT_AMBULATORY_CARE_PROVIDER_SITE_OTHER): Payer: 59 | Admitting: Allergy

## 2021-08-03 ENCOUNTER — Encounter: Payer: Self-pay | Admitting: Allergy

## 2021-08-03 VITALS — BP 124/90 | HR 89 | Temp 98.7°F | Resp 18 | Ht 65.75 in | Wt 129.8 lb

## 2021-08-03 DIAGNOSIS — L509 Urticaria, unspecified: Secondary | ICD-10-CM | POA: Diagnosis not present

## 2021-08-03 DIAGNOSIS — L299 Pruritus, unspecified: Secondary | ICD-10-CM

## 2021-08-03 DIAGNOSIS — L503 Dermatographic urticaria: Secondary | ICD-10-CM

## 2021-08-03 NOTE — Assessment & Plan Note (Signed)
Pruritic rash x 8 months lasting less than 1 hour at a time. Improved with daily zyrtec and Singulair but concerned about taking meds for long term. Denies any changes in diet, meds, personal care products or recent infections. No triggers noted. Denies any other associated symptoms. Tried to eliminate various foods and smoking with no improvement.   Patient had +2 dermatographism on exam today.  Discussed with patient that he has dermatographism and unable to skin prick test today as everything will show up due to his dermatographism.  Dermatographism is not triggered by any allergies but it's caused by excess histamine cells of the skin.  Continue taking zyrtec (cetirizine) 10mg  daily to control symptoms. May take twice a day if needed. . See below for proper skin care. . Avoid the following potential triggers: alcohol, tight clothing, NSAIDs, hot showers and getting overheated. . Get bloodwork as below to rule out other etiologies.

## 2021-08-03 NOTE — Patient Instructions (Addendum)
You have dermatographic urticaria. This means that your skin is producing excess amount of histamine which is making you itch and hive up.  Usually there is NO allergic trigger for this and it goes away eventually.   Continue taking zyrtec (cetirizine) 10mg  daily to control symptoms. May take twice a day if needed.  I can't do any skin testing on you today because of your sensitive skin and I do not think this is due to anything you are eating or exposed to.   See below for proper skin care. Avoid the following potential triggers: alcohol, tight clothing, NSAIDs, hot showers and getting overheated.  Get bloodwork to rule out other etiologies.  We are ordering labs, so please allow 1-2 weeks for the results to come back. With the newly implemented Cures Act, the labs might be visible to you at the same time that they become visible to me. However, I will not address the results until all of the results are back, so please be patient.  In the meantime, continue recommendations in your patient instructions, including avoidance measures (if applicable), until you hear from me.  Follow up in 3 months or sooner if needed.  Skin care recommendations  Bath time: Always use lukewarm water. AVOID very hot or cold water. Keep bathing time to 5-10 minutes. Do NOT use bubble bath. Use a mild soap and use just enough to wash the dirty areas. Do NOT scrub skin vigorously.  After bathing, pat dry your skin with a towel. Do NOT rub or scrub the skin.  Moisturizers and prescriptions:  ALWAYS apply moisturizers immediately after bathing (within 3 minutes). This helps to lock-in moisture. Use the moisturizer several times a day over the whole body. Good summer moisturizers include: Aveeno, CeraVe, Cetaphil. Good winter moisturizers include: Aquaphor, Vaseline, Cerave, Cetaphil, Eucerin, Vanicream. When using moisturizers along with medications, the moisturizer should be applied about one hour after  applying the medication to prevent diluting effect of the medication or moisturize around where you applied the medications. When not using medications, the moisturizer can be continued twice daily as maintenance.  Laundry and clothing: Avoid laundry products with added color or perfumes. Use unscented hypo-allergenic laundry products such as Tide free, Cheer free & gentle, and All free and clear.  If the skin still seems dry or sensitive, you can try double-rinsing the clothes. Avoid tight or scratchy clothing such as wool. Do not use fabric softeners or dyer sheets.

## 2021-08-11 LAB — ALLERGENS W/TOTAL IGE AREA 2

## 2021-08-11 LAB — CBC WITH DIFFERENTIAL/PLATELET
Basophils Absolute: 0 10*3/uL (ref 0.0–0.2)
Basos: 1 %
EOS (ABSOLUTE): 0.1 10*3/uL (ref 0.0–0.4)
Eos: 2 %
Hematocrit: 47.2 % (ref 37.5–51.0)
Hemoglobin: 16.3 g/dL (ref 13.0–17.7)
Immature Grans (Abs): 0 10*3/uL (ref 0.0–0.1)
Immature Granulocytes: 0 %
Lymphocytes Absolute: 2 10*3/uL (ref 0.7–3.1)
Lymphs: 35 %
MCH: 28.8 pg (ref 26.6–33.0)
MCHC: 34.5 g/dL (ref 31.5–35.7)
MCV: 83 fL (ref 79–97)
Monocytes Absolute: 0.7 10*3/uL (ref 0.1–0.9)
Monocytes: 12 %
Neutrophils Absolute: 2.8 10*3/uL (ref 1.4–7.0)
Neutrophils: 50 %
Platelets: 298 10*3/uL (ref 150–450)
RBC: 5.66 x10E6/uL (ref 4.14–5.80)
RDW: 12.8 % (ref 11.6–15.4)
WBC: 5.6 10*3/uL (ref 3.4–10.8)

## 2021-08-11 LAB — SEDIMENTATION RATE: Sed Rate: 10 mm/hr (ref 0–15)

## 2021-08-11 LAB — ALPHA-GAL PANEL
Allergen Lamb IgE: <0.1 kU/L
Beef IgE: <0.1 kU/L
IgE (Immunoglobulin E), Serum: 294 IU/mL (ref 6–495)
O215-IgE Alpha-Gal: <0.1 kU/L
Pork IgE: <0.1 kU/L

## 2021-08-11 LAB — COMPREHENSIVE METABOLIC PANEL
ALT: 16 IU/L (ref 0–44)
AST: 17 IU/L (ref 0–40)
Albumin/Globulin Ratio: 1.4 (ref 1.2–2.2)
Albumin: 4.5 g/dL (ref 4.3–5.2)
Alkaline Phosphatase: 133 IU/L — ABNORMAL HIGH (ref 44–121)
BUN/Creatinine Ratio: 8 — ABNORMAL LOW (ref 9–20)
BUN: 9 mg/dL (ref 6–20)
Bilirubin Total: 0.2 mg/dL (ref 0.0–1.2)
CO2: 26 mmol/L (ref 20–29)
Calcium: 9.4 mg/dL (ref 8.7–10.2)
Chloride: 102 mmol/L (ref 96–106)
Creatinine, Ser: 1.09 mg/dL (ref 0.76–1.27)
Globulin, Total: 3.2 g/dL (ref 1.5–4.5)
Glucose: 74 mg/dL (ref 70–99)
Potassium: 4.1 mmol/L (ref 3.5–5.2)
Sodium: 141 mmol/L (ref 134–144)
Total Protein: 7.7 g/dL (ref 6.0–8.5)
eGFR: 95 mL/min/{1.73_m2} (ref >59)

## 2021-08-11 LAB — ANA W/REFLEX: Anti Nuclear Antibody (ANA): NEGATIVE

## 2021-08-11 LAB — C-REACTIVE PROTEIN: CRP: <1 mg/L (ref 0–10)

## 2021-08-11 LAB — THYROID CASCADE PROFILE: TSH: 1.5 u[IU]/mL (ref 0.450–4.500)

## 2021-08-11 LAB — TRYPTASE: Tryptase: 4.7 ug/L (ref 2.2–13.2)

## 2021-08-11 LAB — C3 AND C4
Complement C3, Serum: 119 mg/dL (ref 82–167)
Complement C4, Serum: 25 mg/dL (ref 12–38)

## 2021-08-11 LAB — CHRONIC URTICARIA: cu index: 8.1 (ref <10)

## 2021-08-11 NOTE — Progress Notes (Signed)
Please call patient.  I reviewed the bloodwork. Blood count, kidney function, electrolytes, thyroid, autoimmune screener, inflammation markers, chronic urticaria index (checks for autoantibodies that trigger mast cells), tryptase (checks for mast cell issues) and alpha gal (checks for red meat allergy) were all normal which is great.   Negative indoor/outdoor allergy panel.   One of your liver enzymes called alkaline phosphatase was slightly elevated - I recommend that we recheck at the next visit as all your other liver enzymes were normal this is not concerning.   Based on these results you have no other trigger for the hives. You most likely have dermatographic urticaria meaning your skin is producing excess histamine which is causing your itchy hives. Usually there is no allergic trigger for this.  Continue taking zyrtec (cetirizine) 10mg  daily to control symptoms. May take twice a day if needed.

## 2021-11-08 NOTE — Progress Notes (Unsigned)
Follow Up Note  RE: Adam Duarte MRN: [id number] DOB: 1995-08-18 Date of Office Visit: 11/09/2021  Referring provider: Wendie Agreste, MD Primary care provider: Wendie Agreste, MD  Chief Complaint: Urticaria (Same issue as last time )  History of Present Illness: I had the pleasure of seeing Adam Duarte for a follow up visit at the [facility] on 11/09/2021. He is a 26 y.o. male, who is being followed for dermatographic urticaria. His previous allergy office visit was on 08/03/2021 with Dr. Maudie Mercury. Today is a regular follow up visit.  Dermatographic urticaria Usually breaks out in the mornings which resolves after taking zyrtec. He notices more symptoms if late in taking zyrtec.   Currently taking zyrtec 65m once a day in the morning.  Sometimes takes it twice a day about 1-2 times per week when he feels like it's worse.   When this first started apparently he had the flu and had some episodes of chest tightness. This resolved and denies current symptoms.  Assessment and Plan: AGeanis a 26y.o. male with: Dermatographic urticaria Past history - Pruritic rash x 8 months lasting less than 1 hour at a time. Improved with daily zyrtec and Singulair but concerned about taking meds for long term. Denies any changes in diet, meds, personal care products or recent infections. No triggers noted. Denies any other associated symptoms. Tried to eliminate various foods and smoking with no improvement. +2 dermatographism on exam. Interim history - 2023 Bloodwork (CBC diff, CMP, TSH, ANA, ESR, CRP, CU, tryptase, Alpha gal) all normal. Negative environmental panel. AP slightly elevated. Controlled with zyrtec 168m1-2 twice a day.  You most likely have dermatographic urticaria - meaning your skin is producing excess histamine which is causing your itchy hives. Usually there is no allergic trigger for this. Continue taking zyrtec (cetirizine) 1061maily to control  symptoms. May take twice a day if needed. If no issues then you can also try to come off the medication.  Continue proper skin care. Avoid the following potential triggers: alcohol, tight clothing, NSAIDs, hot showers and getting overheated. Get bloodwork to look at AP.   Return in about 6 months (around 05/10/2022).  No orders of the defined types were placed in this encounter.  Lab Orders         Comprehensive metabolic panel      Diagnostics: None.  Medication List:  Current Outpatient Medications  Medication Sig Dispense Refill   cetirizine (ZYRTEC ALLERGY) 10 MG tablet Take 1 tablet (10 mg total) by mouth at bedtime. 90 tablet 2   No current facility-administered medications for this visit.   Allergies: No Known Allergies I reviewed his past medical history, social history, family history, and environmental history and no significant changes have been reported from his previous visit.  Review of Systems  Constitutional:  Negative for appetite change, chills, fever and unexpected weight change.  HENT:  Negative for congestion and rhinorrhea.   Eyes:  Negative for itching.  Respiratory:  Negative for cough, chest tightness, shortness of breath and wheezing.   Cardiovascular:  Negative for chest pain.  Gastrointestinal:  Negative for abdominal pain.  Genitourinary:  Negative for difficulty urinating.  Skin:  Positive for rash.  Neurological:  Negative for headaches.    Objective: BP 126/72   Pulse 91   Temp 98.7 F (37.1 C)   Resp 18   SpO2 99%  There is no height or weight on file to calculate BMI.  Physical Exam Vitals and nursing note reviewed.  Constitutional:      Appearance: Normal appearance. He is well-developed.  HENT:     Head: Normocephalic and atraumatic.     Right Ear: Tympanic membrane and external ear normal.     Left Ear: Tympanic membrane and external ear normal.     Nose: Nose normal.     Mouth/Throat:     Mouth: Mucous membranes are moist.      Pharynx: Oropharynx is clear.  Eyes:     Conjunctiva/sclera: Conjunctivae normal.  Cardiovascular:     Rate and Rhythm: Normal rate and regular rhythm.     Heart sounds: Normal heart sounds. No murmur heard.    No friction rub. No gallop.  Pulmonary:     Effort: Pulmonary effort is normal.     Breath sounds: Normal breath sounds. No wheezing, rhonchi or rales.  Musculoskeletal:     Cervical back: Neck supple.  Skin:    General: Skin is warm.     Findings: No rash.  Neurological:     Mental Status: He is alert and oriented to person, place, and time.  Psychiatric:        Behavior: Behavior normal.    Previous notes and tests were reviewed. The plan was reviewed with the patient/family, and all questions/concerned were addressed.  It was my pleasure to see [name] today and participate in his care. Please feel free to contact me with any questions or concerns.  Sincerely,  Rexene Alberts, DO Allergy & Immunology  [facility] office: [place] office: [phone]

## 2021-11-09 ENCOUNTER — Ambulatory Visit (INDEPENDENT_AMBULATORY_CARE_PROVIDER_SITE_OTHER): Payer: 59 | Admitting: Allergy

## 2021-11-09 ENCOUNTER — Encounter: Payer: Self-pay | Admitting: Allergy

## 2021-11-09 VITALS — BP 126/72 | HR 91 | Temp 98.7°F | Resp 18

## 2021-11-09 DIAGNOSIS — L503 Dermatographic urticaria: Secondary | ICD-10-CM | POA: Diagnosis not present

## 2021-11-09 NOTE — Patient Instructions (Addendum)
You most likely have dermatographic urticaria - meaning your skin is producing excess histamine which is causing your itchy hives. Usually there is no allergic trigger for this.  Continue taking zyrtec (cetirizine) 10mg  daily to control symptoms. May take twice a day if needed. If no issues then you can also try to come off the medication.   Continue proper skin care. Avoid the following potential triggers: alcohol, tight clothing, NSAIDs, hot showers and getting overheated.  Get bloodwork to look at the alkaline phosphatase.  We are ordering labs, so please allow 1-2 weeks for the results to come back. With the newly implemented Cures Act, the labs might be visible to you at the same time that they become visible to me. However, I will not address the results until all of the results are back, so please be patient.  In the meantime, continue recommendations in your patient instructions, including avoidance measures (if applicable), until you hear from me.  Follow up in 6 months or sooner if needed.  Skin care recommendations  Bath time: Always use lukewarm water. AVOID very hot or cold water. Keep bathing time to 5-10 minutes. Do NOT use bubble bath. Use a mild soap and use just enough to wash the dirty areas. Do NOT scrub skin vigorously.  After bathing, pat dry your skin with a towel. Do NOT rub or scrub the skin.  Moisturizers and prescriptions:  ALWAYS apply moisturizers immediately after bathing (within 3 minutes). This helps to lock-in moisture. Use the moisturizer several times a day over the whole body. Good summer moisturizers include: Aveeno, CeraVe, Cetaphil. Good winter moisturizers include: Aquaphor, Vaseline, Cerave, Cetaphil, Eucerin, Vanicream. When using moisturizers along with medications, the moisturizer should be applied about one hour after applying the medication to prevent diluting effect of the medication or moisturize around where you applied the medications.  When not using medications, the moisturizer can be continued twice daily as maintenance.  Laundry and clothing: Avoid laundry products with added color or perfumes. Use unscented hypo-allergenic laundry products such as Tide free, Cheer free & gentle, and All free and clear.  If the skin still seems dry or sensitive, you can try double-rinsing the clothes. Avoid tight or scratchy clothing such as wool. Do not use fabric softeners or dyer sheets.

## 2021-11-09 NOTE — Assessment & Plan Note (Signed)
Past history - Pruritic rash x 8 months lasting less than 1 hour at a time. Improved with daily zyrtec and Singulair but concerned about taking meds for long term. Denies any changes in diet, meds, personal care products or recent infections. No triggers noted. Denies any other associated symptoms. Tried to eliminate various foods and smoking with no improvement. +2 dermatographism on exam. Interim history - 2023 Bloodwork (CBC diff, CMP, TSH, ANA, ESR, CRP, CU, tryptase, Alpha gal) all normal. Negative environmental panel. AP slightly elevated. Controlled with zyrtec 54m 1-2 twice a day.  You most likely have dermatographic urticaria - meaning your skin is producing excess histamine which is causing your itchy hives. Usually there is no allergic trigger for this. Continue taking zyrtec (cetirizine) 138mdaily to control symptoms. May take twice a day if needed. If no issues then you can also try to come off the medication.  Continue proper skin care. Avoid the following potential triggers: alcohol, tight clothing, NSAIDs, hot showers and getting overheated. Get bloodwork to look at AP.

## 2021-11-10 LAB — COMPREHENSIVE METABOLIC PANEL
ALT: 19 IU/L (ref 0–44)
AST: 25 IU/L (ref 0–40)
Albumin/Globulin Ratio: 1.5 (ref 1.2–2.2)
Albumin: 4.3 g/dL (ref 4.3–5.2)
Alkaline Phosphatase: 126 IU/L — ABNORMAL HIGH (ref 44–121)
BUN/Creatinine Ratio: 13 (ref 9–20)
BUN: 11 mg/dL (ref 6–20)
Bilirubin Total: 0.3 mg/dL (ref 0.0–1.2)
CO2: 27 mmol/L (ref 20–29)
Calcium: 9 mg/dL (ref 8.7–10.2)
Chloride: 103 mmol/L (ref 96–106)
Creatinine, Ser: 0.87 mg/dL (ref 0.76–1.27)
Globulin, Total: 2.8 g/dL (ref 1.5–4.5)
Glucose: 89 mg/dL (ref 70–99)
Potassium: 4.1 mmol/L (ref 3.5–5.2)
Sodium: 141 mmol/L (ref 134–144)
Total Protein: 7.1 g/dL (ref 6.0–8.5)
eGFR: 121 mL/min/{1.73_m2} (ref >59)

## 2022-03-02 ENCOUNTER — Other Ambulatory Visit: Payer: Self-pay | Admitting: Family Medicine

## 2022-03-02 DIAGNOSIS — L509 Urticaria, unspecified: Secondary | ICD-10-CM

## 2023-03-15 ENCOUNTER — Other Ambulatory Visit: Payer: Self-pay | Admitting: Family Medicine

## 2023-03-15 DIAGNOSIS — L509 Urticaria, unspecified: Secondary | ICD-10-CM

## 2023-04-29 ENCOUNTER — Other Ambulatory Visit (HOSPITAL_COMMUNITY)
Admission: RE | Admit: 2023-04-29 | Discharge: 2023-04-29 | Disposition: A | Discharge status: Home / Self Care | Source: Ambulatory Visit | Attending: Family Medicine | Admitting: Family Medicine

## 2023-04-29 ENCOUNTER — Encounter: Payer: Self-pay | Admitting: Family Medicine

## 2023-04-29 ENCOUNTER — Ambulatory Visit (INDEPENDENT_AMBULATORY_CARE_PROVIDER_SITE_OTHER): Payer: 59 | Admitting: Family Medicine

## 2023-04-29 VITALS — BP 118/68 | HR 92 | Temp 98.3°F | Ht 65.0 in | Wt 123.8 lb

## 2023-04-29 DIAGNOSIS — Z13220 Encounter for screening for lipoid disorders: Secondary | ICD-10-CM | POA: Diagnosis not present

## 2023-04-29 DIAGNOSIS — Z131 Encounter for screening for diabetes mellitus: Secondary | ICD-10-CM

## 2023-04-29 DIAGNOSIS — Z720 Tobacco use: Secondary | ICD-10-CM

## 2023-04-29 DIAGNOSIS — Z113 Encounter for screening for infections with a predominantly sexual mode of transmission: Secondary | ICD-10-CM

## 2023-04-29 DIAGNOSIS — Z0000 Encounter for general adult medical examination without abnormal findings: Secondary | ICD-10-CM

## 2023-04-29 NOTE — Progress Notes (Signed)
 Subjective:  Patient ID: Adam Duarte, male    DOB: 08/20/1995  Age: 28 y.o. MRN: [id number]  CC:  Chief Complaint  Patient presents with   Annual Exam    Pt notes was recommended to have ears cleaned out, is not fasting     HPI Adam Duarte presents for Annual Exam  Declines interpreter - English spoken with understanding expressed.   Normal labs in 2023 including CMP, A1c, HIV, RPR, urine cytology.   Referred to allergist for urticaria at his last physical. Has seen Dr. Danae Duncans in 2023. Stable with zyrtec  1-2 times per week, no recent flares of urticaria. Stable with intermittent dosing. Dermatographic urticaria. No recent singulair .   No other health changes.   Had physical at work.  Wax in canals, no hearing difficulty, no home treatments. Uses earplugs for hearing protection, recent  hearing exam ok.         04/29/2023    2:04 PM 04/27/2021    2:10 PM 10/15/2019    2:53 PM 09/18/2019    3:26 PM 09/18/2019    3:25 PM  Depression screen PHQ 2/9  Decreased Interest 0 0 0 0 0  Down, Depressed, Hopeless 0 0 0 0 0  PHQ - 2 Score 0 0 0 0 0  Altered sleeping 1 0     Tired, decreased energy 1 0     Change in appetite 0 0     Feeling bad or failure about yourself  0 0     Trouble concentrating 0 0     Moving slowly or fidgety/restless 0 0     Suicidal thoughts 0 0     PHQ-9 Score 2 0     Difficult doing work/chores  Not difficult at all       Health Maintenance  Topic Date Due   Hepatitis C Screening  Never done   Pneumococcal Vaccine 48-19 Years old (1 of 2 - PCV) Never done   COVID-19 Vaccine (2 - 2024-25 season) 09/02/2022   INFLUENZA VACCINE  08/02/2023   DTaP/Tdap/Td (2 - Td or Tdap) 09/17/2029   HIV Screening  Completed   HPV VACCINES  Aged Out   Meningococcal B Vaccine  Aged Out   No FH of CA.   Immunization History  Administered Date(s) Administered   Influenza,inj,Quad PF,6+ Mos 09/26/2017, 09/18/2019   Janssen (J&J) SARS-COV-2 Vaccination  07/18/2019   Tdap 09/18/2019  Nonsmoker - chewing tobacco only.   Sexually active  - same partner, but does want to check STI testing. No sx's. Adam Duarte unprotected intercourse - usually with condom.   No results found. Wears glasses. Last optho visit in 12/2022.   Dental: appt last week.   Alcohol: none  Tobacco: nicotine - chewing tobacco/dip. Cessation recommended.   Exercise:physically active at work.   Wt Readings from Last 3 Encounters:  04/29/23 123 lb 12.8 oz (56.2 kg)  08/03/21 129 lb 12 oz (58.9 kg)  04/27/21 136 lb (61.7 kg)      History Patient Active Problem List   Diagnosis Date Noted   Dermatographic urticaria 08/03/2021   Tinea versicolor 10/15/2019   Nausea without vomiting 10/15/2019   Decreased hearing of both ears 10/15/2019   No past medical history on file. No past surgical history on file. No Known Allergies Prior to Admission medications   Medication Sig Start Date End Date Taking? Authorizing Provider  cetirizine  (ZYRTEC ) 10 MG tablet TAKE 1 TABLET BY MOUTH EVERYDAY AT BEDTIME Patient taking differently: 1 tablet  a week 03/02/22  Yes Benjiman Bras, MD   Social History   Socioeconomic History   Marital status: Single    Spouse name: Not on file   Number of children: Not on file   Years of education: Not on file   Highest education level: Not on file  Occupational History   Not on file  Tobacco Use   Smoking status: Every Day   Smokeless tobacco: Never   Tobacco comments:    hooka  Vaping Use   Vaping status: Never Used  Substance and Sexual Activity   Alcohol use: Yes    Comment: twice a month   Drug use: Never   Sexual activity: Yes  Other Topics Concern   Not on file  Social History Narrative   Not on file   Social Drivers of Health   Financial Resource Strain: Not on file  Food Insecurity: Not on file  Transportation Needs: Not on file  Physical Activity: Not on file  Stress: Not on file  Social Connections: Not on file   Intimate Partner Violence: Not on file    Review of Systems 13 point review of systems per patient health survey noted.  Negative other than as indicated above or in HPI.    Objective:   Vitals:   04/29/23 1404  BP: 118/68  Pulse: 92  Temp: 98.3 F (36.8 C)  TempSrc: Temporal  SpO2: 100%  Weight: 123 lb 12.8 oz (56.2 kg)  Height: 5\' 5"  (1.651 m)     Physical Exam Vitals reviewed.  Constitutional:      Appearance: He is well-developed.  HENT:     Head: Normocephalic and atraumatic.     Right Ear: External ear normal.     Left Ear: External ear normal.     Ears:     Comments: Minimal nonobstructive cerumen at external portion canal bilaterally..  Remainder of canal and TM appears normal. Eyes:     Conjunctiva/sclera: Conjunctivae normal.     Pupils: Pupils are equal, round, and reactive to light.  Neck:     Thyroid : No thyromegaly.  Cardiovascular:     Rate and Rhythm: Normal rate and regular rhythm.     Heart sounds: Normal heart sounds.  Pulmonary:     Effort: Pulmonary effort is normal. No respiratory distress.     Breath sounds: Normal breath sounds. No wheezing.  Abdominal:     General: There is no distension.     Palpations: Abdomen is soft.     Tenderness: There is no abdominal tenderness.  Musculoskeletal:        General: No tenderness. Normal range of motion.     Cervical back: Normal range of motion and neck supple.  Lymphadenopathy:     Cervical: No cervical adenopathy.  Skin:    General: Skin is warm and dry.  Neurological:     Mental Status: He is alert and oriented to person, place, and time.     Deep Tendon Reflexes: Reflexes are normal and symmetric.  Psychiatric:        Behavior: Behavior normal.        Assessment & Plan:  Adam Duarte is a 28 y.o. male . Annual physical exam  Routine screening for STI (sexually transmitted infection) - Plan: Hepatitis C Antibody, HIV Antibody (routine testing w rflx), RPR, Urine cytology  ancillary only  Screening for diabetes mellitus - Plan: Comprehensive metabolic panel with GFR  Screening for hyperlipidemia - Plan: Comprehensive metabolic panel with GFR,  Lipid panel  Smokeless tobacco use -anticipatory guidance as below in AVS, screening labs above. Health maintenance items as above in HPI discussed/recommended as applicable.  - Recommended cessation of smokeless tobacco, handout given.  Recent dental visit with oral cancer screening. - Minimal nonobstructive cerumen, option of Debrox over-the-counter if any increased amounts or symptoms with RTC precautions if needed for lavage.  I do not anticipate this will be an issue.  - Screening labs as above including STI screening, asymptomatic.  Safer sex practices with condoms discussed.  No orders of the defined types were placed in this encounter.  Patient Instructions  Thanks for coming in today. If any concerns on labs I will let you know.   You only have a small amount of earwax. No treatment needed at this time, ut if ears feel blocked, you can use Debrox over the counter, or we can treat here if needed.   Take care!  ??????? ?? ??????? ????? ???? ??????? ???????? Smokeless Tobacco Information, Adult  ?????? ???? ???? ????? ??? ???? ???????? ????? ?????????. ?? ????? ????? ??? ?????. ????? ??? ?????? ??? ?????? ????? ??? ?? ????? ??????? ?????? ????. ???? ???????? ?? ?????? ??????? ???? ?? ???? ??? ????????. ?????? ???? ?????? ?? ????? ???? ???? ?? ??? ????? ?? ??????. ??? ?? ????? ??? ????? ?? ??????. ?????? ????? ???? ?????? ?? ????? ?????? ????? ?????? ?? ????? ??????:  ????? ???? ?????.  ??? ?? ??????.  ????? ????.  ??? ????? ?? ?????. ??? ????? ?? ??? ?? ???? ?? ???? ???? ???? ?????? ?? ??????. ?????? ?????? ?? ????? ????? ?? ??????? ??? "?????" ????? ???? ?? ?????? ?? ??????. ?? ???? ????? ???? ?????? ???? ????? ???? ????? ????? ??? ?????? ?? ?????? ?????????? ???? ???? ?? ??? ??? ??? ?? ????? ?????. ?????  ??????? ????? ???? ?????? ?? ??? ??????? ??? ???:  ???????. ?? ????? ????? ??? ??? ????? ??????? ????????. ???? ??? ??? ????? ???? ?????? ?????? ???? ??????? ??????????.  ???? ??? ?????? ?????? ?????? ????? ????? ??? ?????? ??? ???? ?????? ????? ??????? ????????.  ???????.  ??????? ?????. ??????? ?????? ??????? ????? ???? ?????? ???? ???? ??????? ?? ??????? ??? ???? ??? ????? ???????? (??????? ???????).  ?????? ???? ????????? ???: ? ?????? ?? ??????. ? ???? ??????? ??? ????? ?????? ?? ?????. ? ?????? ????. ? ???? ?????? ??????. ? ???? ????? ?? ??? ????? ?? ???? ???????. ? ???? ??? ??????? ?????? ?? ??? (???????). ?? ????? ??? ??????? ????? ???? ??????:  ?????? ???? ????? ??????.  ????? ?????. ???? ?? ????? ????? ???? ????? ?????? ???? ??????? ???? ??????. ?? ??????? ???? ?????? ??????? ??????? ?? ????? ?????? ???? ???????? ?? ???? ??????? ?????? ??????? ?? ????? ????? ???? ??????. ?? ????? ??? ??? ??????. ?? ?????? ??? ??????? ????? ??? ??????? ?? ????? ?????:  ??? ??????? ?????? ?? ???????. ????? ?????? ???? ???????. ????? ??? ????? ?????????.  ???? ????? ????? ?? ??????? ?????. ????? ???? ??????? ?? ???? ??? ????? ?????? ??????? ??? ????? ??? ???? ??????? ?? ?? ?????? ?? ?? ??????.  ??? ??????? ???????? ???????? ???????? ???? ????? ???? ?? ????? ????? ???? ?????? (????????). ??????? ??????.  ???? ????? ????? ???????? ??? ????? ?? ????? ?????. ???? ?? ?????? ??? ?? ??????? ??????? ??? ????? ???? ???? ?? ???? ???? ??????? ?? ????? ?????.  ???? ??? ???? ?? ????? ????? ?? ?? ?? ???? ??????. ????? ??? ?????? ??????? ???????: ? ???? ?? ???? ?????? ????? ?? ????? ????? ???????. ? ???? ?? ???? ?????? ?????? ???? ???????? ????? ?????? ????? ???? ?????. ? ??? ?????? ????? ?? ????? ???? ??????? ???????? ??????. ? ??? ????? ??? ?? ??? ??????? ???????.  ???? ??? ?????? ???? ??? ??? ????? ?? ??????? ?????. ??? ????? ?? ???????? ?? ???? ??? ??????? ?? ???????. ????? ??? ????? ?????? ???? ????? ???????  ?? ????? ????? ????? ??? ???? ?????? ????.  ?????. ???? ???? ?? ???? ???? ????? ???? ???? ?? ??????? ????. ????????? ??? ???? ?? ??? ??????? ????? ?? ???? ?????? ????????.  ??? ??? ????? ????????? ?????. ???? ???????? ???????? ??????. ????? ?? ???? ?? ????? ???? ??? ???? ???? ??????. ????? ?????? ??? ????? ???? ???? ??????? ?????? ??? ???? ???? ?????? ??? ????? ??????? ?? ??????? ????? ???? ??????? ??? ????? ?????? ??? ???????? ???:  ??? ????? (  Smoke Free):? [email or web address]  ????? ?? ????? (Be Tobacco Free):? [email or web address]  ?? ??????? ?? ??????? (Tobacco Quitline): 1-855-QUIT-VET? [phone]).  ?????? ??????? ??? ?1-800-QUIT-NOW (????? ?[phone]?). ????? ?????? ??? ?????? ?? ?????????: ????? ????? ?????? ??? ????? ??????? ????? ???? ?????? ?????? ??????? ??? ?? ???? ??? ???????:  American Cancer Society (????? ??????? ?????????): [email or web address]  [employer] Cancer Institute (?????? ?????? ???????): [email or web address]  ????? ?????? ??????? ???????? (Centers for Disease Control and Prevention):? [email or web address]  ????? ?????? ??????? (Food and Drug Administration):? [email or web address] ???? ???????? ??????? ?????? ?? ??????? ???????:  ????? ?? ??????? ?? ????? ????? ???? ?????? ????????? ??? ????.  ??? ???? ??? ????? ?? ??? ????? ?? ???.  ????? ?????.  ???? ?? ????.  ????? ????? ??? ??? ?????.  ???? ?????? ?? ??????? ?? ??????. ????  ????? ????? ???? ?????? ??? ?????? ?? ?????? ?????????? ???????? ??????? ???? ???? ???????.  ????????? ???? ???????? ???? ?? ????? ???? ?????? ????? ??????? ????? ??? ????.  ???? ????? ???????? ?? ????? ????? ???? ?????? ?????? ??? ??? ????? ?????? ?????? ?????.  ???? ????? ????? ???????? ??? ????? ?? ????? ?????. ??????? ??? ????? ???? ???? ?? ???? ???? ??????? ?? ????? ?????.  ???? ???????? ?? ???? ??????? ?????? ???? ????? ???????? ?? ??????? ????? ???? ??????. ?? ????? ??? ??? ??????. ??? ????? ?? ??? ????????? ?? ???? ??????  ????????? ???? ?????? ???? ??????? ??????. ???? ?? ?????? ??? ????? ???? ?? ???? ?? ???? ??????? ??????.? Document Revised: 09/29/2020 Document Reviewed: 09/29/2020 Elsevier Patient Education  2024 [employer] Inc.  ??????? ???????? ?????? ?? ??? 21-39 ????? Preventive Care 27-75 Years Old, Male ???? ????? ??????? ???????? ??? ???????? ??? ?????? ??????? ???????? ?? ???? ??????? ?????? ??? ?? ???? ???????? ?????? ????????. ?????? ?????? ??????? ???????? ????? ???? ????? ?????? ???????. ???? ???? ????? ??????? ????????? ?????????? ???? ????? ??????? ????????? ?? ????? ???? ??????? ?????? ??????? ?? ??:  ??????? ??????? ??? ?? ???: ? ???????? ?????? ???? ????? ??? ?? ???. ? ??????? ?????? ???????.  ????? ?????? ???????? ??? ?? ???: ? ??????? ???????? ????????. ? ?????? ??????? ??????? ??????? ????. ? ?????? ??????.  ????? ???????? ??? ?? ???: ? ????? ????????? ???????? ?? ????????? ?? ????? ?? ????????. ? ??????? ?????? ??? ??????? ???????. ? ?????? ??????? ????????? ?????? ????? ??????. ? ???????? ???? ????? ???????? ??? ??????? ???? ?????? ????? ???????. ? ??????? ?????? ??????? ?? ?????. ? ????? ??????. ????? ???????? ?? ???? ???? ??????? ?????? ????? ?? ???:  ????? ??????. ????? ??????? ???? ???????? ????? ???? ???? ????? (BMI). ????? ???? ????? (BMI) ????? ?????? ?? ????? ?? ?????? ?????? ?? ??.  ???? ?????. ?? ???? ?? ?????. ????? ??? ??????? ????? ?? ??? ??? ???? ?? ?????? ?????? ?? ??? ??? ????? ?? ?????? ????????? ??????? ?????? ?????? ??? ?????? ?? ????? ?????? ??????? ??? ????.  ???? ????? ????? ???? ????.  ???? ????? ?????.  ????? ??????? ??? ??? ??????. ?? ????????? ???? ????????  ????? ?? ???? ?? ??? ???? ??? ???? ???? ?????. ?????? ??????? ??????? ?????? ??????? ??? ????????? ??? ????? ??????? ????? ?????? ?????? ???? ??????? ?????? ??? ????? ?? ???? ?????. ?? ???????? ???? ????? ??? ???????? ?????? ?? ???? ???? ??????? ?????? ?????? ???? ????? ?????? ?? ?????? ????  ?????. ????? ???? ???:  ??????? ?????? ????????????.  ???? ??????. ????? ??? ???? ??? ???? (????????) ??? ???????? ?? ????? ?????? ????? ????? (????).  ????? ?????? ????? ??????? "?".  ????? ?????? ????? ??????? "?".  ????? ????? ?? ????? ??? ??????? ?????? (HIV).  ???? ????? ?? ?????? ???????? ??? ??????? ?????? (  STI)? ??? ??? ?? ???????? ???? ??????? ???? ??????? ?? ??????. ???? ??? ???? ??????? ?????? ??????? ??????? ??????? ??????? ???? ???????? ???? ?????? ??? ????? ?????? ?? ??????. ???? ??? ????????? ?? ??????: ????? ??????   ???? ?????? ??????? ????? ????? ??? ??????? ?????????? ??????? ??????? ??????? ??????????? ????? ?????? ??????? ??????? ????? ?????.  ???? ????? ????? ?? ??????? ???? ??? ???? ???? ??????.  ????? ??????????? ???????? ??? ?????? ???? ??????? ?????? ??????? ??.  ?? ?????? ????????? ???????? ??? ??? ??? ???? ??????? ?????? ???????? ?? ???.   ??????? ?????? ????????? ????????:  ? ??? ????? ??? ????????? ??? ?? ?????? ??????? ?? ????? ?? ?????? ??????.  ? ???? ???? ?????? ?? ?? ????? ???????. ?? ???????? ???????? ????? ??????? ?????? ???? ??? 12 ????? (355 ??) ?? ?????? ?? ??? ??? 5 ?????? (148 ??) ?? ?????? ?? ??? ??? ????? ???? (44 ??) ?? ????????? ???????? ??????. ??? ??????  ???? ?????? ???????? ?????? ????? ??? ????????? ?????? ??????. ?????? ???? ??????? ??? ????? ??????.  ???? ???????? ???? 30 ????? ??? ????? ????? 5 ???? ?? ???? ?? ?????.  ?? ?????? ?????? ????? ??? ????????? ?? ?????. ????? ??? ??????? ???? ????? ?????? ??????? ???????????? ??? ??????? ???????????. ?????? ???? ??????? ?????? ??? ????? ??? ???????? ??????? ?? ???????.  ?? ?????? ????????.  ????? ???????? ??????? ?? ???? ?????? ?????. ???? ??? ??????? ?????? ?????? ?? ????? ???? ?? ????? ??????? ??????? ?? ???? ??????? ???????? ?????? (STIs).  ???? ?????? ??? ?????? ???? ?????? ??? ??????? ??????? ???: ? ?????? ?????? ?? ?????? ?? ???????? ??? ????????. ? ????? ????????. ? ?????? ??? ???  ????? ??. ? ???? ??? ????? ?? ??????? ??????.  ??? ?????? ?????? ??? ????????? ??????? ?? ????? ?????. ???????  ???? ??? ?????? ???? ?????? ?? ????? ????? ??????? ?? ???? ???????.  ?? ????? ???????: ? ??? ????? ????????? ????????. ?? ???? ??????? ?? ??? ??? ???? ????????? ??? ???????. ? ??? ????? ?? ???? ?? ?????? ???? ??? ?????. ? ?????? ???? ?????? ??????? ??????. ? ????? ???? ?????? ?? ???? ?????.  ???? ??? ?????? ???? ?????? ?? ??????? ???????? ?? ????? ??????? ????????.  ??? ??? ???? ???? ???? ?? ?????? ???? ?? ????? ??????? ??????? ?????? ???????.  ???? ???????? ??? ??? ????? ?????? ???? ?? ????. ?? ?????? ????????  ????? ???? ??????? ?????? ??????? ?? ??? ????? ?????? ?????? ??? ???????.  ???? ???? ??????? ?????? ?? ??? ????? ???? ?????? ???? ????? ???????.  ???? ??? ?????? ??? ???? ????????? ?? ????????. ??? ????? ?? ??? ????????? ?? ???? ?????? ????????? ???? ?????? ???? ??????? ??????. ???? ?? ?????? ??? ????? ???? ?? ???? ?? ???? ??????? ??????.? Document Revised: 07/13/2020 Document Reviewed: 07/13/2020 Elsevier Patient Education  2024 Elsevier Inc.   Carbamide Peroxide Ear Solution ?? ??? ??????? ????? ???????? ?????????? ????? ??? ?????.  ????? ?? ???? ????? ??? ????? ???????? ??? ???? ??????. ???? ??????? ??? ?????? ?????? ????? ???? ???? ??????? ?????? ?? ??????? ??? ???? ???? ?????. ????? ???????? ???????? ????????:? Auro Ear, Auro Earache Relief, Clearcanal, Clinere, Debrox, Ear Drops, Ear Wax Removal, Ear Wax Remover, Earwax Treatment, Murine, Thera-Ear ?? ?? ??????? ???? ??? ?? ???? ??? ???? ??????? ?????? ??? ????? ??? ???????  ?? ????? ??? ????? ?? ??? ??? ?????? ??? ?? ??? ??????? ????:  ????  ??????? ?????  ?????? ?? ??????? ?? ????? ?? ?????  ????  ???? ????? ???????? (??? ?? ???? ?????)  ?? ??? ??? ???? ?? ?????? ???? ???????? ??????????? ?? ????????? ?? ???????? ??????????  ?? ??? ??? ???? ?? ?????? ???? ??????? ??????? ?? ???????? ?? ???????? ?? ??????  ???????  ????? ?? ?????? ????? ??????? ???????? ??? ??? ???? ??????? ??? ??????? ??? ?????? ???? ????????? ?? ???? ????? ???????? ???.  ???? ????????? ????.  ???? ???? ??? ???? ?????????.  ???? ????? ??????? ?? ???? ??????? ???????? ?? ???? ??? ???  1 ??? 2 ?????.  ?????? ?? ??? ????? ??????? ????? ?????.  ?? ????? ??????? ?? ??????? ?? ???? ?????.  ??? ????? ???????? ???? ??? ??? ????? ??????? ????? ????? ???? 5 ????? ??? ???? ??? ??????? ?? ???? ?????.  ???? ????? ??? ????? ???? ?? ???? ????? ???? ?? ???? ?? 5 ??? 10 ????? ????? ???????? ???.  ??? ??? ?????? ??? ??? ??? ???????? ?? ????? ??????.  ?? ???? ??? ??????? ???? ???? ?? ????? ?????? ?? ?? ??? ???.  ?? ???? ??????? ??? ?????????.  ???? ??? ?????? ???? ???????. ???? ??? ???? ??????? ???? ??????? ??? ?????? ???????.  ?? ??? ?? ??? ?????? ???? ?? ???? ??????? ????? ?????? ?? ????? 12 ??? ????? ????? ??????? ???????? ??? ??? ???? ????? ??????????. ?????? ??????? : ??? ?????? ??? ?????? ???? ????? ???? ?? ??? ??????? ???? ????? ?????? ?? ?????? ?? ???? ??????? ?????.<br />??????: ?????? ??? ?????? ?? ???? ??? ???. ?? ????? ????? ??? ?? ????? ??? ??????. ???? ?? ???? (????) ????? ??? ???? ????? ???????? ?? ???? ??? ????.  ??? ??? ??? ??? ????? ??????? ??????? ? ??????? ??? ?????? ???.  ?? ?????? ???? ?????? ?? ????? ?????. ?? ?? ??????? ???? ?? ?????? ?? ??? ??????? ????????? ???????? ??? ??????.  ?? ?????? ?? ?????? ???? ????? ??? ???? ???? ??????. ??? ??????? ?? ?? ??? ?? ????????? ????????. ?? ?????? ???? ??????? ?????? ????? ??? ??????? ?? ??????? ?? ??????? ???? ???? ?? ???????? ???????? ???? ????????. ?????? ????? ??? ??? ???? ?? ???? ?????? ?? ?????? ?????? ??? ???????. ?? ?????? ??? ??????? ?? ?????. ?? ???? ??? ???? ????? ??? ??????? ??? ??????? ??? ?????? ??? ???? ????????? ???? ?????.  ?? ??????? ????? ?? 4 ???? ??? ?????? ???? ??????.  ???? ????? ?????? ??? ?? ???? ????? ??????? ?? ???? ???? ???? ?? ??? ????? ????? ?? ????????? ?? ???? ??  ??????. ?? ?? ?????? ???????? ???? ???? ?? ??????? ??? ???? ??? ???????  ?????? ???????? ???? ??? ?? ???? ???? ??????? ??????? ?? ??? ?? ???? ??? ????:  ??????? ????????--????? ??????? ?????? ??????????? (?????)? ???? ????? ?? ?????? ?? ?????? ?? ?????  ????? ??? ????? ?????? ???????? ???? ?? ????? ????? ???? (???? ????? ?? ???? ??????? ??? ??? ?????? ?? ???? ????? ?????):  ???? ??? ?? ??? ?? ????? ??? ????????? ??? ??????? ?? ?? ??? ?? ?????? ???????? ????????. ???? ?????? ????????? ?????? ?? ?????? ????????. ????? ??????? ?? ?????? ???????? ?????? ??????? ???????? ????????? (FDA) ??? ????? ?[phone]??? ??? ??? ???? ???????? ??????? ???? ?????? ?? ?????? ??????? ?? ????????? ???????. ?? ???????? ?? ???? ????? ?????? ??? 15 ?30 ???? ????? (59 ?86 ???? ????????) ?? ????? ????? ????? ??????? ?????.  ????? ??????? ?????? ?? ??????? ??????? ???? ????? ???????.  ???? ?? ?? ???? ??? ?????? ??? ????? ?????? ???????? ??????? ??? ?????? ?? ??????. ??????: ??? ??????? ????? ?? ????: ?? ?? ???? ??? ???????? ?? ????????? ???????. ??? ???? ???? ?? ????? ?? ??? ??????? ????? ??? ?????? ?? ??????? ?? ???? ??????? ??????.   2024 Elsevier/Gold Standard (2021-05-17 00:00:00)      Signed,   Caro Christmas, MD [facility] 04/29/23 2:59 PM

## 2023-04-29 NOTE — Patient Instructions (Addendum)
 Thanks for coming in today. If any concerns on labs I will let you know.   You only have a small amount of earwax. No treatment needed at this time, ut if ears feel blocked, you can use Debrox over the counter, or we can treat here if needed.   Take care!  ??????? ?? ??????? ????? ???? ??????? ???????? Smokeless Tobacco Information, Adult  ?????? ???? ???? ????? ??? ???? ???????? ????? ?????????. ?? ????? ????? ??? ?????. ????? ??? ?????? ??? ?????? ????? ??? ?? ????? ??????? ?????? ????. ???? ???????? ?? ?????? ??????? ???? ?? ???? ??? ????????. ?????? ???? ?????? ?? ????? ???? ???? ?? ??? ????? ?? ??????. ??? ?? ????? ??? ????? ?? ??????. ?????? ????? ???? ?????? ?? ????? ?????? ????? ?????? ?? ????? ??????:  ????? ???? ?????.  ??? ?? ??????.  ????? ????.  ??? ????? ?? ?????. ??? ????? ?? ??? ?? ???? ?? ???? ???? ???? ?????? ?? ??????. ?????? ?????? ?? ????? ????? ?? ??????? ??? "?????" ????? ???? ?? ?????? ?? ??????. ?? ???? ????? ???? ?????? ???? ????? ???? ????? ????? ??? ?????? ?? ?????? ?????????? ???? ???? ?? ??? ??? ??? ?? ????? ?????. ????? ??????? ????? ???? ?????? ?? ??? ??????? ??? ???:  ???????. ?? ????? ????? ??? ??? ????? ??????? ????????. ???? ??? ??? ????? ???? ?????? ?????? ???? ??????? ??????????.  ???? ??? ?????? ?????? ?????? ????? ????? ??? ?????? ??? ???? ?????? ????? ??????? ????????.  ???????.  ??????? ?????. ??????? ?????? ??????? ????? ???? ?????? ???? ???? ??????? ?? ??????? ??? ???? ??? ????? ???????? (??????? ???????).  ?????? ???? ????????? ???: ? ?????? ?? ??????. ? ???? ??????? ??? ????? ?????? ?? ?????. ? ?????? ????. ? ???? ?????? ??????. ? ???? ????? ?? ??? ????? ?? ???? ???????. ? ???? ??? ??????? ?????? ?? ??? (???????). ?? ????? ??? ??????? ????? ???? ??????:  ?????? ???? ????? ??????.  ????? ?????. ???? ?? ????? ????? ???? ????? ?????? ???? ??????? ???? ??????. ?? ??????? ???? ?????? ??????? ??????? ?? ????? ?????? ???? ???????? ?? ????  ??????? ?????? ??????? ?? ????? ????? ???? ??????. ?? ????? ??? ??? ??????. ?? ?????? ??? ??????? ????? ??? ??????? ?? ????? ?????:  ??? ??????? ?????? ?? ???????. ????? ?????? ???? ???????. ????? ??? ????? ?????????.  ???? ????? ????? ?? ??????? ?????. ????? ???? ??????? ?? ???? ??? ????? ?????? ??????? ??? ????? ??? ???? ??????? ?? ?? ?????? ?? ?? ??????.  ??? ??????? ???????? ???????? ???????? ???? ????? ???? ?? ????? ????? ???? ?????? (????????). ??????? ??????.  ???? ????? ????? ???????? ??? ????? ?? ????? ?????. ???? ?? ?????? ??? ?? ??????? ??????? ??? ????? ???? ???? ?? ???? ???? ??????? ?? ????? ?????.  ???? ??? ???? ?? ????? ????? ?? ?? ?? ???? ??????. ????? ??? ?????? ??????? ???????: ? ???? ?? ???? ?????? ????? ?? ????? ????? ???????. ? ???? ?? ???? ?????? ?????? ???? ???????? ????? ?????? ????? ???? ?????. ? ??? ?????? ????? ?? ????? ???? ??????? ???????? ??????. ? ??? ????? ??? ?? ??? ??????? ???????.  ???? ??? ?????? ???? ??? ??? ????? ?? ??????? ?????. ??? ????? ?? ???????? ?? ???? ??? ??????? ?? ???????. ????? ??? ????? ?????? ???? ????? ??????? ?? ????? ????? ????? ??? ???? ?????? ????.  ?????. ???? ???? ?? ???? ???? ????? ???? ???? ?? ??????? ????. ????????? ??? ???? ?? ??? ??????? ????? ?? ???? ?????? ????????.  ??? ??? ????? ????????? ?????. ???? ???????? ???????? ??????. ????? ?? ???? ?? ????? ???? ??? ???? ???? ??????. ????? ?????? ??? ????? ???? ???? ??????? ?????? ??? ???? ???? ?????? ??? ????? ??????? ?? ??????? ????? ???? ??????? ??? ????? ?????? ??? ???????? ???:  ??? ????? (  Smoke Free):? [email or web address]  ????? ?? ????? (Be Tobacco Free):? [email or web address]  ?? ??????? ?? ??????? (Tobacco Quitline): 1-855-QUIT-VET? [phone]).  ?????? ??????? ??? ?1-800-QUIT-NOW (????? ?[phone]?). ????? ?????? ??? ?????? ?? ?????????: ????? ????? ?????? ??? ????? ??????? ????? ???? ?????? ?????? ??????? ??? ?? ???? ??? ???????:  [employer] Cancer Society  (????? ??????? ?????????): [email or web address]  [employer] (?????? ?????? ???????): [email or web address]  ????? ?????? ??????? ???????? (Centers for Disease Control and Prevention):? [email or web address]  ????? ?????? ??????? (Food and Drug Administration):? [email or web address] ???? ???????? ??????? ?????? ?? ??????? ???????:  ????? ?? ??????? ?? ????? ????? ???? ?????? ????????? ??? ????.  ??? ???? ??? ????? ?? ??? ????? ?? ???.  ????? ?????.  ???? ?? ????.  ????? ????? ??? ??? ?????.  ???? ?????? ?? ??????? ?? ??????. ????  ????? ????? ???? ?????? ??? ?????? ?? ?????? ?????????? ???????? ??????? ???? ???? ???????.  ????????? ???? ???????? ???? ?? ????? ???? ?????? ????? ??????? ????? ??? ????.  ???? ????? ???????? ?? ????? ????? ???? ?????? ?????? ??? ??? ????? ?????? ?????? ?????.  ???? ????? ????? ???????? ??? ????? ?? ????? ?????. ??????? ??? ????? ???? ???? ?? ???? ???? ??????? ?? ????? ?????.  ???? ???????? ?? ???? ??????? ?????? ???? ????? ???????? ?? ??????? ????? ???? ??????. ?? ????? ??? ??? ??????. ??? ????? ?? ??? ????????? ?? ???? ?????? ????????? ???? ?????? ???? ??????? ??????. ???? ?? ?????? ??? ????? ???? ?? ???? ?? ???? ??????? ??????.? Document Revised: 09/29/2020 Document Reviewed: 09/29/2020 Elsevier Patient Education  2024 Elsevier Inc.  ??????? ???????? ?????? ?? ??? 21-39 ????? Preventive Care 21-53 Years Old, Male ???? ????? ??????? ???????? ??? ???????? ??? ?????? ??????? ???????? ?? ???? ??????? ?????? ??? ?? ???? ???????? ?????? ????????. ?????? ?????? ??????? ???????? ????? ???? ????? ?????? ???????. ???? ???? ????? ??????? ????????? ?????????? ???? ????? ??????? ????????? ?? ????? ???? ??????? ?????? ??????? ?? ??:  ??????? ??????? ??? ?? ???: ? ???????? ?????? ???? ????? ??? ?? ???. ? ??????? ?????? ???????.  ????? ?????? ???????? ??? ?? ???: ? ??????? ???????? ????????. ? ?????? ??????? ??????? ??????? ????. ? ?????? ??????.  ????? ???????? ???  ?? ???: ? ????? ????????? ???????? ?? ????????? ?? ????? ?? ????????. ? ??????? ?????? ??? ??????? ???????. ? ?????? ??????? ????????? ?????? ????? ??????. ? ???????? ???? ????? ???????? ??? ??????? ???? ?????? ????? ???????. ? ??????? ?????? ??????? ?? ?????. ? ????? ??????. ????? ???????? ?? ???? ???? ??????? ?????? ????? ?? ???:  ????? ??????. ????? ??????? ???? ???????? ????? ???? ???? ????? (BMI). ????? ???? ????? (BMI) ????? ?????? ?? ????? ?? ?????? ?????? ?? ??.  ???? ?????. ?? ???? ?? ?????. ????? ??? ??????? ????? ?? ??? ??? ???? ?? ?????? ?????? ?? ??? ??? ????? ?? ?????? ????????? ??????? ?????? ?????? ??? ?????? ?? ????? ?????? ??????? ??? ????.  ???? ????? ????? ???? ????.  ???? ????? ?????.  ????? ??????? ??? ??? ??????. ?? ????????? ???? ????????  ????? ?? ???? ?? ??? ???? ??? ???? ???? ?????. ?????? ??????? ??????? ?????? ??????? ??? ????????? ??? ????? ??????? ????? ?????? ?????? ???? ??????? ?????? ??? ????? ?? ???? ?????. ?? ???????? ???? ????? ??? ???????? ?????? ?? ???? ???? ??????? ?????? ?????? ???? ????? ?????? ?? ?????? ???? ?????. ????? ???? ???:  ??????? ?????? ????????????.  ???? ??????. ????? ??? ???? ??? ???? (????????) ??? ???????? ?? ????? ?????? ????? ????? (????).  ????? ?????? ????? ??????? "?".  ????? ?????? ????? ??????? "?".  ????? ????? ?? ????? ??? ??????? ?????? (HIV).  ???? ????? ?? ?????? ???????? ??? ??????? ?????? (  STI)? ??? ??? ?? ???????? ???? ??????? ???? ??????? ?? ??????. ???? ??? ???? ??????? ?????? ??????? ??????? ??????? ??????? ???? ???????? ???? ?????? ??? ????? ?????? ?? ??????. ???? ??? ????????? ?? ??????: ????? ??????   ???? ?????? ??????? ????? ????? ??? ??????? ?????????? ??????? ??????? ??????? ??????????? ????? ?????? ??????? ??????? ????? ?????.  ???? ????? ????? ?? ??????? ???? ??? ???? ???? ??????.  ????? ??????????? ???????? ??? ?????? ???? ??????? ?????? ??????? ??.  ?? ?????? ????????? ???????? ??? ??? ??? ????  ??????? ?????? ???????? ?? ???.   ??????? ?????? ????????? ????????:  ? ??? ????? ??? ????????? ??? ?? ?????? ??????? ?? ????? ?? ?????? ??????.  ? ???? ???? ?????? ?? ?? ????? ???????. ?? ???????? ???????? ????? ??????? ?????? ???? ??? 12 ????? (355 ??) ?? ?????? ?? ??? ??? 5 ?????? (148 ??) ?? ?????? ?? ??? ??? ????? ???? (44 ??) ?? ????????? ???????? ??????. ??? ??????  ???? ?????? ???????? ?????? ????? ??? ????????? ?????? ??????. ?????? ???? ??????? ??? ????? ??????.  ???? ???????? ???? 30 ????? ??? ????? ????? 5 ???? ?? ???? ?? ?????.  ?? ?????? ?????? ????? ??? ????????? ?? ?????. ????? ??? ??????? ???? ????? ?????? ??????? ???????????? ??? ??????? ???????????. ?????? ???? ??????? ?????? ??? ????? ??? ???????? ??????? ?? ???????.  ?? ?????? ????????.  ????? ???????? ??????? ?? ???? ?????? ?????. ???? ??? ??????? ?????? ?????? ?? ????? ???? ?? ????? ??????? ??????? ?? ???? ??????? ???????? ?????? (STIs).  ???? ?????? ??? ?????? ???? ?????? ??? ??????? ??????? ???: ? ?????? ?????? ?? ?????? ?? ???????? ??? ????????. ? ????? ????????. ? ?????? ??? ??? ????? ??. ? ???? ??? ????? ?? ??????? ??????.  ??? ?????? ?????? ??? ????????? ??????? ?? ????? ?????. ???????  ???? ??? ?????? ???? ?????? ?? ????? ????? ??????? ?? ???? ???????.  ?? ????? ???????: ? ??? ????? ????????? ????????. ?? ???? ??????? ?? ??? ??? ???? ????????? ??? ???????. ? ??? ????? ?? ???? ?? ?????? ???? ??? ?????. ? ?????? ???? ?????? ??????? ??????. ? ????? ???? ?????? ?? ???? ?????.  ???? ??? ?????? ???? ?????? ?? ??????? ???????? ?? ????? ??????? ????????.  ??? ??? ???? ???? ???? ?? ?????? ???? ?? ????? ??????? ??????? ?????? ???????.  ???? ???????? ??? ??? ????? ?????? ???? ?? ????. ?? ?????? ????????  ????? ???? ??????? ?????? ??????? ?? ??? ????? ?????? ?????? ??? ???????.  ???? ???? ??????? ?????? ?? ??? ????? ???? ?????? ???? ????? ???????.  ???? ??? ?????? ??? ???? ????????? ?? ????????. ??? ????? ?? ???  ????????? ?? ???? ?????? ????????? ???? ?????? ???? ??????? ??????. ???? ?? ?????? ??? ????? ???? ?? ???? ?? ???? ??????? ??????.? Document Revised: 07/13/2020 Document Reviewed: 07/13/2020 Elsevier Patient Education  2024 Elsevier Inc.   Carbamide Peroxide Ear Solution ?? ??? ??????? ????? ???????? ?????????? ????? ??? ?????.  ????? ?? ???? ????? ??? ????? ???????? ??? ???? ??????. ???? ??????? ??? ?????? ?????? ????? ???? ???? ??????? ?????? ?? ??????? ??? ???? ???? ?????. ????? ???????? ???????? ????????:? Auro Ear, Auro Earache Relief, Clearcanal, Clinere, Debrox, Ear Drops, Ear Wax Removal, Ear Wax Remover, Earwax Treatment, Murine, Thera-Ear ?? ?? ??????? ???? ??? ?? ???? ??? ???? ??????? ?????? ??? ????? ??? ???????  ?? ????? ??? ????? ?? ??? ??? ?????? ??? ?? ??? ??????? ????:  ????  ??????? ?????  ?????? ?? ??????? ?? ????? ?? ?????  ????  ???? ????? ???????? (??? ?? ???? ?????)  ?? ??? ??? ???? ?? ?????? ???? ???????? ??????????? ?? ????????? ?? ???????? ??????????  ?? ??? ??? ???? ?? ?????? ???? ??????? ??????? ?? ???????? ?? ???????? ?? ?????? ???????  ????? ?? ?????? ????? ??????? ???????? ??? ??? ???? ??????? ??? ??????? ??? ?????? ???? ????????? ?? ???? ????? ???????? ???.  ???? ????????? ????.  ???? ???? ??? ???? ?????????.  ???? ????? ??????? ?? ???? ??????? ???????? ?? ???? ??? ???  1 ??? 2 ?????.  ?????? ?? ??? ????? ??????? ????? ?????.  ?? ????? ??????? ?? ??????? ?? ???? ?????.  ??? ????? ???????? ???? ??? ??? ????? ??????? ????? ????? ???? 5 ????? ??? ???? ??? ??????? ?? ???? ?????.  ???? ????? ??? ????? ???? ?? ???? ????? ???? ?? ???? ?? 5 ??? 10 ????? ????? ???????? ???.  ??? ??? ?????? ??? ??? ??? ???????? ?? ????? ??????.  ?? ???? ??? ??????? ???? ???? ?? ????? ?????? ?? ?? ??? ???.  ?? ???? ??????? ??? ?????????.  ???? ??? ?????? ???? ???????. ???? ??? ???? ??????? ???? ??????? ??? ?????? ???????.  ?? ??? ?? ??? ?????? ???? ?? ???? ??????? ????? ?????? ?? ????? 12  ??? ????? ????? ??????? ???????? ??? ??? ???? ????? ??????????. ?????? ??????? : ??? ?????? ??? ?????? ???? ????? ???? ?? ??? ??????? ???? ????? ?????? ?? ?????? ?? ???? ??????? ?????.<br />??????: ?????? ??? ?????? ?? ???? ??? ???. ?? ????? ????? ??? ?? ????? ??? ??????. ???? ?? ???? (????) ????? ??? ???? ????? ???????? ?? ???? ??? ????.  ??? ??? ??? ??? ????? ??????? ??????? ? ??????? ??? ?????? ???.  ?? ?????? ???? ?????? ?? ????? ?????. ?? ?? ??????? ???? ?? ?????? ?? ??? ??????? ????????? ???????? ??? ??????.  ?? ?????? ?? ?????? ???? ????? ??? ???? ???? ??????. ??? ??????? ?? ?? ??? ?? ????????? ????????. ?? ?????? ???? ??????? ?????? ????? ??? ??????? ?? ??????? ?? ??????? ???? ???? ?? ???????? ???????? ???? ????????. ?????? ????? ??? ??? ???? ?? ???? ?????? ?? ?????? ?????? ??? ???????. ?? ?????? ??? ??????? ?? ?????. ?? ???? ??? ???? ????? ??? ??????? ??? ??????? ??? ?????? ??? ???? ????????? ???? ?????.  ?? ??????? ????? ?? 4 ???? ??? ?????? ???? ??????.  ???? ????? ?????? ??? ?? ???? ????? ??????? ?? ???? ???? ???? ?? ??? ????? ????? ?? ????????? ?? ???? ?? ??????. ?? ?? ?????? ???????? ???? ???? ?? ??????? ??? ???? ??? ???????  ?????? ???????? ???? ??? ?? ???? ???? ??????? ??????? ?? ??? ?? ???? ??? ????:  ??????? ????????--????? ??????? ?????? ??????????? (?????)? ???? ????? ?? ?????? ?? ?????? ?? ?????  ????? ??? ????? ?????? ???????? ???? ?? ????? ????? ???? (???? ????? ?? ???? ??????? ??? ??? ?????? ?? ???? ????? ?????):  ???? ??? ?? ??? ?? ????? ??? ????????? ??? ??????? ?? ?? ??? ?? ?????? ???????? ????????. ???? ?????? ????????? ?????? ?? ?????? ????????. ????? ??????? ?? ?????? ???????? ?????? ??????? ???????? ????????? (FDA) ??? ????? ?.[id number]??? ??? ??? ???? ???????? ??????? ???? ?????? ?? ?????? ??????? ?? ????????? ???????. ?? ???????? ?? ???? ????? ?????? ??? 15 ?30 ???? ????? (59 ?86 ???? ????????) ?? ????? ????? ????? ??????? ?????.  ????? ??????? ?????? ??  ??????? ??????? ???? ????? ???????.  ???? ?? ?? ???? ??? ?????? ??? ????? ?????? ???????? ??????? ??? ?????? ?? ??????. ??????: ??? ??????? ????? ?? ????: ?? ?? ???? ??? ???????? ?? ????????? ???????. ??? ???? ???? ?? ????? ?? ??? ??????? ????? ??? ?????? ?? ??????? ?? ???? ??????? ??????.   2024 Elsevier/Gold Standard (2021-05-17 00:00:00)

## 2023-04-30 LAB — HEPATITIS C ANTIBODY: Hepatitis C Ab: NONREACTIVE

## 2023-04-30 LAB — LIPID PANEL
Cholesterol: 132 mg/dL (ref 0–200)
HDL: 49.3 mg/dL (ref >39.00)
LDL Cholesterol: 65 mg/dL (ref 0–99)
NonHDL: 82.47
Total CHOL/HDL Ratio: 3
Triglycerides: 87 mg/dL (ref 0.0–149.0)
VLDL: 17.4 mg/dL (ref 0.0–40.0)

## 2023-04-30 LAB — COMPREHENSIVE METABOLIC PANEL WITH GFR
ALT: 16 U/L (ref 0–53)
AST: 17 U/L (ref 0–37)
Albumin: 4.5 g/dL (ref 3.5–5.2)
Alkaline Phosphatase: 91 U/L (ref 39–117)
BUN: 11 mg/dL (ref 6–23)
CO2: 33 meq/L — ABNORMAL HIGH (ref 19–32)
Calcium: 9.2 mg/dL (ref 8.4–10.5)
Chloride: 102 meq/L (ref 96–112)
Creatinine, Ser: 0.77 mg/dL (ref 0.40–1.50)
GFR: 120.29 mL/min (ref >60.00)
Glucose, Bld: 67 mg/dL — ABNORMAL LOW (ref 70–99)
Potassium: 4.4 meq/L (ref 3.5–5.1)
Sodium: 138 meq/L (ref 135–145)
Total Bilirubin: 0.3 mg/dL (ref 0.2–1.2)
Total Protein: 7.6 g/dL (ref 6.0–8.3)

## 2023-04-30 LAB — HIV ANTIBODY (ROUTINE TESTING W REFLEX): HIV 1&2 Ab, 4th Generation: NONREACTIVE

## 2023-04-30 LAB — RPR: RPR Ser Ql: NONREACTIVE

## 2023-05-01 LAB — URINE CYTOLOGY ANCILLARY ONLY
Chlamydia: NEGATIVE
Comment: NEGATIVE
Comment: NEGATIVE
Comment: NORMAL
Neisseria Gonorrhea: NEGATIVE
Trichomonas: NEGATIVE

## 2023-05-04 ENCOUNTER — Encounter: Payer: Self-pay | Admitting: Family Medicine

## 2023-07-23 ENCOUNTER — Other Ambulatory Visit: Payer: Self-pay | Admitting: Family Medicine

## 2023-07-23 DIAGNOSIS — L509 Urticaria, unspecified: Secondary | ICD-10-CM

## 2023-08-02 ENCOUNTER — Other Ambulatory Visit: Payer: Self-pay | Admitting: Family Medicine

## 2023-08-02 DIAGNOSIS — L509 Urticaria, unspecified: Secondary | ICD-10-CM

## 2023-08-02 NOTE — Telephone Encounter (Signed)
 Copied from CRM [id number]. Topic: Clinical - Medication Refill >> Aug 02, 2023  1:55 PM Martinique E wrote: Medication: cetirizine  (ZYRTEC ) 10 MG tablet  Has the patient contacted their pharmacy? Yes, was advised to contact [facility]CP for more refills. (Agent: If no, request that the patient contact the pharmacy for the refill. If patient does not wish to contact the pharmacy document the reason why and proceed with request.) (Agent: If yes, when and what did the pharmacy advise?)  This is the patient's preferred pharmacy:  [id number] - [place], [place] - [address] AT [facility] [address] [place] [address] Phone: [phone] Fax: [phone]  Is this the correct pharmacy for this prescription? Yes If no, delete pharmacy and type the correct one.   Has the prescription been filled recently? Yes  Is the patient out of the medication? No, one day left.  Has the patient been seen for an appointment in the last year OR does the patient have an upcoming appointment? Yes  Can we respond through MyChart? Yes  Agent: Please be advised that Rx refills may take up to 3 business days. We ask that you follow-up with your pharmacy.

## 2024-04-29 ENCOUNTER — Encounter: Admitting: Family Medicine
